# Patient Record
Sex: Female | Born: 1955 | Race: White | Hispanic: No | Marital: Married | Smoking: Never smoker
Health system: Southern US, Community
[De-identification: ages and names within clinical notes are randomized; demographics above are authoritative.]

## PROBLEM LIST (undated history)

## (undated) DIAGNOSIS — K219 Gastro-esophageal reflux disease without esophagitis: Secondary | ICD-10-CM

## (undated) HISTORY — PX: ANAL FISSURE REPAIR: SHX2312

## (undated) HISTORY — PX: BREAST SURGERY: SHX581

---

## 2008-06-07 ENCOUNTER — Other Ambulatory Visit: Admission: RE | Admit: 2008-06-07 | Discharge: 2008-06-07 | Payer: Self-pay | Admitting: Obstetrics and Gynecology

## 2009-06-27 ENCOUNTER — Other Ambulatory Visit: Admission: RE | Admit: 2009-06-27 | Discharge: 2009-06-27 | Payer: Self-pay | Admitting: Obstetrics and Gynecology

## 2010-06-27 ENCOUNTER — Other Ambulatory Visit (HOSPITAL_COMMUNITY)
Admission: RE | Admit: 2010-06-27 | Discharge: 2010-06-27 | Disposition: A | Discharge status: Home / Self Care | Payer: BC Managed Care – PPO | Source: Ambulatory Visit | Attending: Obstetrics and Gynecology | Admitting: Obstetrics and Gynecology

## 2010-06-27 DIAGNOSIS — Z01419 Encounter for gynecological examination (general) (routine) without abnormal findings: Secondary | ICD-10-CM | POA: Insufficient documentation

## 2011-04-18 ENCOUNTER — Other Ambulatory Visit: Payer: Self-pay | Admitting: Internal Medicine

## 2011-04-18 DIAGNOSIS — R251 Tremor, unspecified: Secondary | ICD-10-CM

## 2011-04-18 DIAGNOSIS — R42 Dizziness and giddiness: Secondary | ICD-10-CM

## 2011-04-23 ENCOUNTER — Other Ambulatory Visit: Payer: BC Managed Care – PPO

## 2012-08-09 ENCOUNTER — Other Ambulatory Visit: Payer: Self-pay | Admitting: Gastroenterology

## 2012-09-21 ENCOUNTER — Encounter (HOSPITAL_COMMUNITY): Payer: Self-pay | Admitting: Pharmacy Technician

## 2012-09-22 ENCOUNTER — Encounter (HOSPITAL_COMMUNITY): Payer: Self-pay | Admitting: *Deleted

## 2012-10-12 ENCOUNTER — Ambulatory Visit (HOSPITAL_COMMUNITY)
Admission: RE | Admit: 2012-10-12 | Discharge: 2012-10-12 | Disposition: A | Discharge status: Home / Self Care | Payer: BC Managed Care – PPO | Source: Ambulatory Visit | Attending: Gastroenterology | Admitting: Gastroenterology

## 2012-10-12 ENCOUNTER — Ambulatory Visit (HOSPITAL_COMMUNITY): Payer: BC Managed Care – PPO | Admitting: Anesthesiology

## 2012-10-12 ENCOUNTER — Encounter (HOSPITAL_COMMUNITY): Payer: Self-pay | Admitting: Anesthesiology

## 2012-10-12 ENCOUNTER — Encounter (HOSPITAL_COMMUNITY)
Admission: RE | Disposition: A | Discharge status: Home / Self Care | Payer: Self-pay | Source: Ambulatory Visit | Attending: Gastroenterology

## 2012-10-12 ENCOUNTER — Encounter (HOSPITAL_COMMUNITY): Payer: Self-pay | Admitting: *Deleted

## 2012-10-12 DIAGNOSIS — F329 Major depressive disorder, single episode, unspecified: Secondary | ICD-10-CM | POA: Insufficient documentation

## 2012-10-12 DIAGNOSIS — G4700 Insomnia, unspecified: Secondary | ICD-10-CM | POA: Insufficient documentation

## 2012-10-12 DIAGNOSIS — F3289 Other specified depressive episodes: Secondary | ICD-10-CM | POA: Insufficient documentation

## 2012-10-12 DIAGNOSIS — F411 Generalized anxiety disorder: Secondary | ICD-10-CM | POA: Insufficient documentation

## 2012-10-12 DIAGNOSIS — Z1211 Encounter for screening for malignant neoplasm of colon: Secondary | ICD-10-CM | POA: Insufficient documentation

## 2012-10-12 DIAGNOSIS — L259 Unspecified contact dermatitis, unspecified cause: Secondary | ICD-10-CM | POA: Insufficient documentation

## 2012-10-12 HISTORY — PX: COLONOSCOPY WITH PROPOFOL: SHX5780

## 2012-10-12 HISTORY — DX: Gastro-esophageal reflux disease without esophagitis: K21.9

## 2012-10-12 SURGERY — COLONOSCOPY WITH PROPOFOL
Anesthesia: General

## 2012-10-12 MED ORDER — MIDAZOLAM HCL 5 MG/5ML IJ SOLN
INTRAMUSCULAR | Status: DC | PRN
  Administered 2012-10-12: 2 mg via INTRAVENOUS

## 2012-10-12 MED ORDER — SODIUM CHLORIDE 0.9 % IV SOLN
INTRAVENOUS | Status: DC
  Administered 2012-10-12 (×2): via INTRAVENOUS

## 2012-10-12 MED ORDER — FENTANYL CITRATE 0.05 MG/ML IJ SOLN
INTRAMUSCULAR | Status: DC | PRN
  Administered 2012-10-12: 50 ug via INTRAVENOUS

## 2012-10-12 MED ORDER — PROPOFOL 10 MG/ML IV BOLUS
INTRAVENOUS | Status: DC | PRN
  Administered 2012-10-12: 50 mg via INTRAVENOUS
  Administered 2012-10-12: 25 mg via INTRAVENOUS
  Administered 2012-10-12: 50 mg via INTRAVENOUS
  Administered 2012-10-12: 25 mg via INTRAVENOUS

## 2012-10-12 SURGICAL SUPPLY — 21 items

## 2012-10-12 NOTE — H&P (Signed)
  Procedure: Baseline screening colonoscopy  History: The patient is a 57 year old female born [birth year]. The patient is scheduled to undergo her first screening colonoscopy with polypectomy to prevent colon cancer.  Chronic medications: Nasonex. Hydroxyzine. Mirtazapine. Ambien.  Past medical and surgical history: Depression. Anxiety. Insomnia. Eczema. Anal fissure repair surgery.  Allergies: Effexor causes itching.  Exam: The patient is alert and lying comfortably on the endoscopy stretcher. Abdomen is soft, flat, and nontender to palpation. Cardiac exam reveals a regular rhythm. Lungs are clear to auscultation.  Plan: Proceed with screening colonoscopy.

## 2012-10-12 NOTE — Anesthesia Postprocedure Evaluation (Signed)
  Anesthesia Post-op Note  Patient: Michelle Rosales  Procedure(s) Performed: Procedure(s) (LRB): COLONOSCOPY WITH PROPOFOL (N/A)  Patient Location: PACU  Anesthesia Type: General  Level of Consciousness: awake and alert   Airway and Oxygen Therapy: Patient Spontanous Breathing  Post-op Pain: mild  Post-op Assessment: Post-op Vital signs reviewed, Patient's Cardiovascular Status Stable, Respiratory Function Stable, Patent Airway and No signs of Nausea or vomiting  Last Vitals:  Filed Vitals:   10/12/12 1342  BP: 94/56  Pulse: 59  Temp: 36.6 C  Resp: 27    Post-op Vital Signs: stable   Complications: No apparent anesthesia complications

## 2012-10-12 NOTE — Transfer of Care (Signed)
Immediate Anesthesia Transfer of Care Note  Patient: Michelle Rosales  Procedure(s) Performed: Procedure(s): COLONOSCOPY WITH PROPOFOL (N/A)  Patient Location: PACU  Anesthesia Type:MAC  Level of Consciousness: awake, sedated and patient cooperative  Airway & Oxygen Therapy: Patient Spontanous Breathing and Patient connected to face mask oxygen  Post-op Assessment: Report given to PACU RN and Post -op Vital signs reviewed and stable  Post vital signs: Reviewed and stable  Complications: No apparent anesthesia complications

## 2012-10-12 NOTE — Anesthesia Preprocedure Evaluation (Addendum)
Anesthesia Evaluation  Patient identified by MRN, date of birth, ID band Patient awake    Reviewed: Allergy & Precautions, H&P , NPO status , Patient's Chart, lab work & pertinent test results  Airway Mallampati: II TM Distance: >3 FB Neck ROM: Full    Dental no notable dental hx.    Pulmonary neg pulmonary ROS,  breath sounds clear to auscultation  Pulmonary exam normal       Cardiovascular negative cardio ROS  Rhythm:Regular Rate:Normal     Neuro/Psych negative neurological ROS  negative psych ROS   GI/Hepatic negative GI ROS, Neg liver ROS,   Endo/Other  negative endocrine ROS  Renal/GU negative Renal ROS  negative genitourinary   Musculoskeletal negative musculoskeletal ROS (+)   Abdominal   Peds negative pediatric ROS (+)  Hematology negative hematology ROS (+)   Anesthesia Other Findings   Reproductive/Obstetrics negative OB ROS                           Anesthesia Physical Anesthesia Plan  ASA: I  Anesthesia Plan: General   Post-op Pain Management:    Induction: Intravenous  Airway Management Planned: Simple Face Mask  Additional Equipment:   Intra-op Plan:   Post-operative Plan:   Informed Consent: I have reviewed the patients History and Physical, chart, labs and discussed the procedure including the risks, benefits and alternatives for the proposed anesthesia with the patient or authorized representative who has indicated his/her understanding and acceptance.   Dental advisory given  Plan Discussed with: CRNA  Anesthesia Plan Comments:        Anesthesia Quick Evaluation

## 2012-10-12 NOTE — Op Note (Signed)
Procedure: Baseline screening colonoscopy  Endoscopist: Danise Edge  Premedication: Propofol administered by anesthesia  Procedure: The patient was placed in the left lateral decubitus position. Anal inspection and digital rectal exam were normal. The Pentax pediatric colonoscope was introduced into the rectum and advanced to the cecum. A normal-appearing appendiceal orifice and ileocecal valve were identified. Colonic preparation for the exam today was good.  Rectum. Normal. Retroflex view of the distal rectum normal.  Sigmoid colon and descending colon. Normal.  Splenic flexure. Normal.  Transverse colon. Normal.  Hepatic flexure. Normal.  Ascending colon. Normal.  Cecum and ileocecal valve. Normal.  Assessment: Normal baseline screening proctocolonoscopy to the cecum.  Recommendations: Schedule repeat screening colonoscopy in 10 years.

## 2012-10-13 ENCOUNTER — Encounter (HOSPITAL_COMMUNITY): Payer: Self-pay | Admitting: Gastroenterology

## 2014-05-11 ENCOUNTER — Other Ambulatory Visit: Payer: Self-pay | Admitting: Internal Medicine

## 2014-05-11 ENCOUNTER — Ambulatory Visit
Admission: RE | Admit: 2014-05-11 | Discharge: 2014-05-11 | Disposition: A | Discharge status: Home / Self Care | Payer: BC Managed Care – PPO | Source: Ambulatory Visit | Attending: Internal Medicine | Admitting: Internal Medicine

## 2014-05-11 DIAGNOSIS — M5489 Other dorsalgia: Secondary | ICD-10-CM

## 2014-05-11 DIAGNOSIS — M25552 Pain in left hip: Secondary | ICD-10-CM

## 2014-06-06 ENCOUNTER — Other Ambulatory Visit: Payer: Self-pay

## 2014-06-06 DIAGNOSIS — Z1231 Encounter for screening mammogram for malignant neoplasm of breast: Secondary | ICD-10-CM

## 2014-06-09 ENCOUNTER — Ambulatory Visit
Admission: RE | Admit: 2014-06-09 | Discharge: 2014-06-09 | Disposition: A | Discharge status: Home / Self Care | Payer: BC Managed Care – PPO | Source: Ambulatory Visit

## 2014-06-09 DIAGNOSIS — Z1231 Encounter for screening mammogram for malignant neoplasm of breast: Secondary | ICD-10-CM

## 2014-06-12 ENCOUNTER — Other Ambulatory Visit: Payer: Self-pay | Admitting: Internal Medicine

## 2014-06-12 DIAGNOSIS — R928 Other abnormal and inconclusive findings on diagnostic imaging of breast: Secondary | ICD-10-CM

## 2014-06-23 ENCOUNTER — Ambulatory Visit
Admission: RE | Admit: 2014-06-23 | Discharge: 2014-06-23 | Disposition: A | Discharge status: Home / Self Care | Payer: BC Managed Care – PPO | Source: Ambulatory Visit | Attending: Internal Medicine | Admitting: Internal Medicine

## 2014-06-23 ENCOUNTER — Other Ambulatory Visit: Payer: Self-pay | Admitting: Internal Medicine

## 2014-06-23 DIAGNOSIS — R928 Other abnormal and inconclusive findings on diagnostic imaging of breast: Secondary | ICD-10-CM

## 2014-06-28 ENCOUNTER — Ambulatory Visit
Admission: RE | Admit: 2014-06-28 | Discharge: 2014-06-28 | Disposition: A | Discharge status: Home / Self Care | Payer: BC Managed Care – PPO | Source: Ambulatory Visit | Attending: Internal Medicine | Admitting: Internal Medicine

## 2014-06-28 DIAGNOSIS — R928 Other abnormal and inconclusive findings on diagnostic imaging of breast: Secondary | ICD-10-CM

## 2014-07-10 ENCOUNTER — Ambulatory Visit: Payer: BC Managed Care – PPO | Admitting: Physical Therapy

## 2020-09-12 DIAGNOSIS — H2513 Age-related nuclear cataract, bilateral: Secondary | ICD-10-CM | POA: Diagnosis not present

## 2020-09-12 DIAGNOSIS — H52201 Unspecified astigmatism, right eye: Secondary | ICD-10-CM | POA: Diagnosis not present

## 2020-09-12 DIAGNOSIS — H5212 Myopia, left eye: Secondary | ICD-10-CM | POA: Diagnosis not present

## 2020-09-12 DIAGNOSIS — H47393 Other disorders of optic disc, bilateral: Secondary | ICD-10-CM | POA: Diagnosis not present

## 2020-09-12 DIAGNOSIS — H524 Presbyopia: Secondary | ICD-10-CM | POA: Diagnosis not present

## 2020-09-12 DIAGNOSIS — H43393 Other vitreous opacities, bilateral: Secondary | ICD-10-CM | POA: Diagnosis not present

## 2020-09-12 DIAGNOSIS — H5201 Hypermetropia, right eye: Secondary | ICD-10-CM | POA: Diagnosis not present

## 2020-09-12 DIAGNOSIS — H40003 Preglaucoma, unspecified, bilateral: Secondary | ICD-10-CM | POA: Diagnosis not present

## 2020-10-02 DIAGNOSIS — Z1231 Encounter for screening mammogram for malignant neoplasm of breast: Secondary | ICD-10-CM | POA: Diagnosis not present

## 2020-10-15 ENCOUNTER — Telehealth: Payer: Self-pay | Admitting: Family Medicine

## 2020-10-15 NOTE — Telephone Encounter (Signed)
Ok to schedule as new patient   

## 2020-10-15 NOTE — Telephone Encounter (Signed)
Pt, is looking to establish care with you she said Dr. Carmelia Roller spoke to you about her    Please advice

## 2020-10-20 NOTE — Progress Notes (Addendum)
[facility] [address], [address] [place], [place] [address] [phone]  Date:  10/24/2020   Name:  Michelle Rosales   DOB:  07-10-1955   MRN:  [id number]  PCP:  Pearline Cables, MD    Chief Complaint: New Patient (Initial Visit)   History of Present Illness:  Michelle Rosales is a 65 y.o. very pleasant female patient who presents with the following: Seen today as a new patient -referral per Dr. Carmelia Roller  Her former MD is leaving her practice so she would like to establish care here.  Her husband is a patient of Dr. 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  She completed 5 years of therapy in 2021 and is no longer taking tamoxifen, she has been cleared by oncology for routine screening mammogram She does take some OTC meds for pain in her joints- esp hips and hands She walks several miles a day  Her only real concern is joint pain.  She has pain in her right hip, this affects her when walking up steps or hills.  She has started using trekking poles for her walks to give her some support.  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   Past Medical History:  Diagnosis Date   GERD (gastroesophageal reflux disease)     Past Surgical History:  Procedure Laterality Date   ANAL FISSURE REPAIR     BREAST SURGERY  Maybe 6 years ago.   non cancerous lump removed from left breast   COLONOSCOPY WITH PROPOFOL N/A 10/12/2012   Procedure: COLONOSCOPY WITH PROPOFOL;  Surgeon: Charolett Bumpers, MD;  Location: [facility];  Service: Endoscopy;  Laterality: N/A;    Social History   Tobacco Use   Smoking status: Never   Smokeless tobacco: Never  Substance Use Topics   Alcohol use: Yes    Comment: occassionally   Drug use: No    Family History  Problem Relation Age of Onset   Arthritis Mother    Asthma Brother    Cancer Sister    Cancer Sister     Allergies  Allergen Reactions   Venlafaxine Itching    Medication list has been reviewed and updated.  Current Outpatient Medications on File Prior to Visit  Medication Sig Dispense Refill   acetaminophen (TYLENOL) 500 MG tablet Take 500 mg by mouth every 6 (six)  hours as needed for pain.     bismuth subsalicylate (PEPTO BISMOL) 262 MG/15ML suspension Take 15 mLs by mouth every 6 (six) hours as needed for indigestion.     Calcium Carbonate (CALTRATE 600 PO) Take 600 mg by mouth daily.     naproxen sodium (ANAPROX) 220 MG tablet Take 220 mg by mouth 2 (two) times daily as needed (for pain).     No current facility-administered medications on file prior to visit.    Review of Systems:  As per HPI- otherwise negative. No chest pain or shortness of breath with exercise, no postmenopausal bleeding  Physical Examination: Vitals:   10/24/20 0943  BP: 124/62  Pulse: 68  Resp: 16  Temp: (!) 97.1 F (36.2 C)  SpO2: 99%   Vitals:   10/24/20 0943  Weight: 135 lb (61.2  kg)  Height: 5\' 6"  (1.676 m)   Body mass index is 21.79 kg/m. Ideal Body Weight: Weight in (lb) to have BMI = 25: 154.6  GEN: no acute distress.  Normal weight, looks well HEENT: Atraumatic, Normocephalic.Bilateral TM wnl, oropharynx normal.  PEERL,EOMI.   Ears and Nose: No external deformity. CV: RRR, No M/G/R. No JVD. No thrill. No extra heart sounds. PULM: CTA B, no wheezes, crackles, rhonchi. No retractions. No resp. distress. No accessory muscle use. ABD: S, NT, ND, +BS. No rebound. No HSM. EXTR: No c/c/e PSYCH: Normally interactive. Conversant.  Some osteoarthritic thickening is present in hand joints Normal range of motion of the right hip  Assessment and Plan: Hip pain, right - Plan: DG HIP UNILAT W OR W/O PELVIS 2-3 VIEWS RIGHT  Screening for deficiency anemia - Plan: CBC  Screening for hyperlipidemia - Plan: Lipid panel  Fatigue, unspecified type - Plan: TSH, VITAMIN D 25 Hydroxy (Vit-D Deficiency, Fractures)  Screening for diabetes mellitus - Plan: Comprehensive metabolic panel, Hemoglobin A1c  Primary osteoarthritis of right hip  Patient today to establish care and discuss a couple of concerns.  We updated her health maintenance and ordered routine labs today.  In general, [name] is in excellent health.  Her only real concern is arthritis pain, especially in the right hip.  We discussed this today, she would be interested in obtaining plain to determine severity of any arthritis.  Otherwise, she is doing very well-she is quite physically active and has maintained a healthy weight Will plan further follow- up pending labs.  This visit occurred during the SARS-CoV-2 public health emergency.  Safety protocols were in place, including screening questions prior to the visit, additional usage of staff PPE, and extensive cleaning of exam room while observing appropriate contact time as indicated for disinfecting solutions.   Signed Marjean Donna, MD  Her hip films came  back as follows, message to patient  DG HIP UNILAT W OR W/O PELVIS 2-3 VIEWS RIGHT  Result Date: 10/24/2020 CLINICAL DATA:  Atraumatic hip pain with suspected osteoarthritis. EXAM: DG HIP (WITH OR WITHOUT PELVIS) 2-3V RIGHT COMPARISON:  None. FINDINGS: There is no evidence of hip fracture or dislocation. No degenerative hip narrowing or notable spurring. Small os acetabulum on the right. Notable L3-4 degenerative disc narrowing with asymmetric right-sided height loss, also seen by 2016 lumbar radiography. IMPRESSION: No acute finding or degenerative hip narrowing. Electronically Signed   By: 2017 M.D.   On: 10/24/2020 10:55     Check on pneumonia vaccine with results report  Received her labs as below, message to pt  Results for orders placed or performed in visit  on 10/24/20  CBC  Result Value Ref Range   WBC 5.3 4.0 - 10.5 K/uL   RBC 3.83 (L) 3.87 - 5.11 Mil/uL   Platelets 160.0 150.0 - 400.0 K/uL   Hemoglobin 12.2 12.0 - 15.0 g/dL   HCT 27.7 41.2 - 87.8 %   MCV 94.9 78.0 - 100.0 fl   MCHC 33.7 30.0 - 36.0 g/dL   RDW 67.6 72.0 - 94.7 %  Comprehensive metabolic panel  Result Value Ref Range   Sodium 139 135 - 145 mEq/L   Potassium 4.3 3.5 - 5.1 mEq/L   Chloride 105 96 - 112 mEq/L   CO2 27 19 - 32 mEq/L   Glucose, Bld 80 70 - 99 mg/dL   BUN 19 6 - 23 mg/dL   Creatinine, Ser 0.96 0.40 - 1.20 mg/dL   Total Bilirubin 0.5 0.2 - 1.2 mg/dL   Alkaline Phosphatase 80 39 - 117 U/L   AST 22 0 - 37 U/L   ALT 18 0 - 35 U/L   Total Protein 6.6 6.0 - 8.3 g/dL   Albumin 4.3 3.5 - 5.2 g/dL   GFR 28.36 >62.94 mL/min   Calcium 9.3 8.4 - 10.5 mg/dL  Hemoglobin T6L  Result Value Ref Range   Hgb A1c MFr Bld 5.9 4.6 - 6.5 %  Lipid panel  Result Value Ref Range   Cholesterol 191 0 - 200 mg/dL   Triglycerides 46.5 0.0 - 149.0 mg/dL   HDL 03.54 >65.68 mg/dL   VLDL 12.7 0.0 - 51.7 mg/dL   LDL Cholesterol 98 0 - 99 mg/dL   Total CHOL/HDL Ratio 3    NonHDL 115.50   TSH  Result  Value Ref Range   TSH 1.00 0.35 - 4.50 uIU/mL  VITAMIN D 25 Hydroxy (Vit-D Deficiency, Fractures)  Result Value Ref Range   VITD 38.50 30.00 - 100.00 ng/mL

## 2020-10-22 ENCOUNTER — Ambulatory Visit: Payer: BC Managed Care – PPO | Admitting: Family Medicine

## 2020-10-24 ENCOUNTER — Encounter: Payer: Self-pay | Admitting: Family Medicine

## 2020-10-24 ENCOUNTER — Ambulatory Visit (HOSPITAL_BASED_OUTPATIENT_CLINIC_OR_DEPARTMENT_OTHER)
Admission: RE | Admit: 2020-10-24 | Discharge: 2020-10-24 | Disposition: A | Discharge status: Home / Self Care | Payer: Medicare HMO | Source: Ambulatory Visit | Attending: Family Medicine | Admitting: Family Medicine

## 2020-10-24 ENCOUNTER — Other Ambulatory Visit: Payer: Self-pay

## 2020-10-24 ENCOUNTER — Ambulatory Visit (INDEPENDENT_AMBULATORY_CARE_PROVIDER_SITE_OTHER): Payer: Medicare HMO | Admitting: Family Medicine

## 2020-10-24 VITALS — BP 124/62 | HR 68 | Temp 97.1°F | Resp 16 | Ht 66.0 in | Wt 135.0 lb

## 2020-10-24 DIAGNOSIS — Z13220 Encounter for screening for lipoid disorders: Secondary | ICD-10-CM

## 2020-10-24 DIAGNOSIS — R5383 Other fatigue: Secondary | ICD-10-CM

## 2020-10-24 DIAGNOSIS — Z130 Encounter for screening for diseases of the blood and blood-forming organs and certain disorders involving the immune mechanism: Secondary | ICD-10-CM

## 2020-10-24 DIAGNOSIS — M1611 Unilateral primary osteoarthritis, right hip: Secondary | ICD-10-CM | POA: Diagnosis not present

## 2020-10-24 DIAGNOSIS — R7303 Prediabetes: Secondary | ICD-10-CM

## 2020-10-24 DIAGNOSIS — Z131 Encounter for screening for diabetes mellitus: Secondary | ICD-10-CM

## 2020-10-24 DIAGNOSIS — M25551 Pain in right hip: Secondary | ICD-10-CM | POA: Insufficient documentation

## 2020-10-24 DIAGNOSIS — M5136 Other intervertebral disc degeneration, lumbar region: Secondary | ICD-10-CM | POA: Diagnosis not present

## 2020-10-24 LAB — COMPREHENSIVE METABOLIC PANEL
ALT: 18 U/L (ref 0–35)
AST: 22 U/L (ref 0–37)
Albumin: 4.3 g/dL (ref 3.5–5.2)
Alkaline Phosphatase: 80 U/L (ref 39–117)
BUN: 19 mg/dL (ref 6–23)
CO2: 27 mEq/L (ref 19–32)
Calcium: 9.3 mg/dL (ref 8.4–10.5)
Chloride: 105 mEq/L (ref 96–112)
Creatinine, Ser: 0.66 mg/dL (ref 0.40–1.20)
GFR: 91.02 mL/min (ref >60.00)
Glucose, Bld: 80 mg/dL (ref 70–99)
Potassium: 4.3 mEq/L (ref 3.5–5.1)
Sodium: 139 mEq/L (ref 135–145)
Total Bilirubin: 0.5 mg/dL (ref 0.2–1.2)
Total Protein: 6.6 g/dL (ref 6.0–8.3)

## 2020-10-24 LAB — TSH: TSH: 1 u[IU]/mL (ref 0.35–4.50)

## 2020-10-24 LAB — CBC
HCT: 36.3 % (ref 36.0–46.0)
Hemoglobin: 12.2 g/dL (ref 12.0–15.0)
MCHC: 33.7 g/dL (ref 30.0–36.0)
MCV: 94.9 fl (ref 78.0–100.0)
Platelets: 160 10*3/uL (ref 150.0–400.0)
RBC: 3.83 Mil/uL — ABNORMAL LOW (ref 3.87–5.11)
RDW: 13.6 % (ref 11.5–15.5)
WBC: 5.3 10*3/uL (ref 4.0–10.5)

## 2020-10-24 LAB — LIPID PANEL
Cholesterol: 191 mg/dL (ref 0–200)
HDL: 75.2 mg/dL (ref >39.00)
LDL Cholesterol: 98 mg/dL (ref 0–99)
NonHDL: 115.5
Total CHOL/HDL Ratio: 3
Triglycerides: 87 mg/dL (ref 0.0–149.0)
VLDL: 17.4 mg/dL (ref 0.0–40.0)

## 2020-10-24 LAB — HEMOGLOBIN A1C: Hgb A1c MFr Bld: 5.9 % (ref 4.6–6.5)

## 2020-10-24 LAB — VITAMIN D 25 HYDROXY (VIT D DEFICIENCY, FRACTURES): VITD: 38.5 ng/mL (ref 30.00–100.00)

## 2020-10-24 NOTE — Patient Instructions (Signed)
It was great to meet you today- take care and I will be in touch with your labs and hip film results asap  I will check on what pneumonia vaccine you might need and let you know  Health Maintenance After Age 67 After age 55, you are at a higher risk for certain long-term diseases and infections as well as injuries from falls. Falls are a major cause of broken bones and head injuries in people who are older than age 65. Getting regular preventive care can help to keep you healthy and well. Preventive care includes getting regular testing and making lifestyle changes as recommended by your health care provider. Talk with your health care provider about: Which screenings and tests you should have. A screening is a test that checks for a disease when you have no symptoms. A diet and exercise plan that is right for you. What should I know about screenings and tests to prevent falls? Screening and testing are the best ways to find a health problem early. Early diagnosis and treatment give you the best chance of managing medical conditions that are common after age 72. Certain conditions and lifestyle choices may make you more likely to have a fall. Your health care provider may recommend: Regular vision checks. Poor vision and conditions such as cataracts can make you more likely to have a fall. If you wear glasses, make sure to get your prescription updated if your vision changes. Medicine review. Work with your health care provider to regularly review all of the medicines you are taking, including over-the-counter medicines. Ask your health care provider about any side effects that may make you more likely to have a fall. Tell your health care provider if any medicines that you take make you feel dizzy or sleepy. Osteoporosis screening. Osteoporosis is a condition that causes the bones to get weaker. This can make the bones weak and cause them to break more easily. Blood pressure screening. Blood pressure  changes and medicines to control blood pressure can make you feel dizzy. Strength and balance checks. Your health care provider may recommend certain tests to check your strength and balance while standing, walking, or changing positions. Foot health exam. Foot pain and numbness, as well as not wearing proper footwear, can make you more likely to have a fall. Depression screening. You may be more likely to have a fall if you have a fear of falling, feel emotionally low, or feel unable to do activities that you used to do. Alcohol use screening. Using too much alcohol can affect your balance and may make you more likely to have a fall. What actions can I take to lower my risk of falls? General instructions Talk with your health care provider about your risks for falling. Tell your health care provider if: You fall. Be sure to tell your health care provider about all falls, even ones that seem minor. You feel dizzy, sleepy, or off-balance. Take over-the-counter and prescription medicines only as told by your health care provider. These include any supplements. Eat a healthy diet and maintain a healthy weight. A healthy diet includes low-fat dairy products, low-fat (lean) meats, and fiber from whole grains, beans, and lots of fruits and vegetables. Home safety Remove any tripping hazards, such as rugs, cords, and clutter. Install safety equipment such as grab bars in bathrooms and safety rails on stairs. Keep rooms and walkways well-lit. Activity  Follow a regular exercise program to stay fit. This will help you maintain your balance.  Ask your health care provider what types of exercise are appropriate for you. If you need a cane or walker, use it as recommended by your health care provider. Wear supportive shoes that have nonskid soles.  Lifestyle Do not drink alcohol if your health care provider tells you not to drink. If you drink alcohol, limit how much you have: 0-1 drink a day for  women. 0-2 drinks a day for men. Be aware of how much alcohol is in your drink. In the [place] one drink equals one typical bottle of beer (12 oz), one-half glass of wine (5 oz), or one shot of hard liquor (1 oz). Do not use any products that contain nicotine or tobacco, such as cigarettes and e-cigarettes. If you need help quitting, ask your health care provider. Summary Having a healthy lifestyle and getting preventive care can help to protect your health and wellness after age 66. Screening and testing are the best way to find a health problem early and help you avoid having a fall. Early diagnosis and treatment give you the best chance for managing medical conditions that are more common for people who are older than age 56. Falls are a major cause of broken bones and head injuries in people who are older than age 57. Take precautions to prevent a fall at home. Work with your health care provider to learn what changes you can make to improve your health and wellness and to prevent falls. This information is not intended to replace advice given to you by your health care provider. Make sure you discuss any questions you have with your healthcare provider. Document Revised: 04/06/2020 Document Reviewed: 04/06/2020 Elsevier Patient Education  2022 [employer].

## 2020-12-31 DIAGNOSIS — T1512XA Foreign body in conjunctival sac, left eye, initial encounter: Secondary | ICD-10-CM | POA: Diagnosis not present

## 2020-12-31 DIAGNOSIS — H5789 Other specified disorders of eye and adnexa: Secondary | ICD-10-CM | POA: Diagnosis not present

## 2021-02-06 ENCOUNTER — Other Ambulatory Visit (HOSPITAL_BASED_OUTPATIENT_CLINIC_OR_DEPARTMENT_OTHER): Payer: Self-pay

## 2021-02-06 ENCOUNTER — Ambulatory Visit: Payer: Medicare HMO | Attending: Internal Medicine

## 2021-02-06 DIAGNOSIS — Z23 Encounter for immunization: Secondary | ICD-10-CM

## 2021-02-06 MED ORDER — INFLUENZA VAC A&B SA ADJ QUAD 0.5 ML IM PRSY
PREFILLED_SYRINGE | INTRAMUSCULAR | 0 refills | Status: DC
  Filled 2021-02-06: qty 0.5, 1d supply, fill #0

## 2021-02-06 NOTE — Progress Notes (Signed)
   Covid-19 Vaccination Clinic  Name:  Michelle Rosales    MRN: [id number] DOB: 03-30-1956  02/06/2021  Michelle Rosales was observed post Covid-19 immunization for 15 minutes without incident. She was provided with Vaccine Information Sheet and instruction to access the V-Safe system.   Michelle Rosales was instructed to call 911 with any severe reactions post vaccine: Difficulty breathing  Swelling of face and throat  A fast heartbeat  A bad rash all over body  Dizziness and weakness

## 2021-02-15 ENCOUNTER — Other Ambulatory Visit (HOSPITAL_BASED_OUTPATIENT_CLINIC_OR_DEPARTMENT_OTHER): Payer: Self-pay

## 2021-02-15 MED ORDER — COVID-19MRNA BIVAL VACC PFIZER 30 MCG/0.3ML IM SUSP
INTRAMUSCULAR | 0 refills | Status: DC
  Filled 2021-02-15: qty 0.3, 1d supply, fill #0

## 2021-02-19 NOTE — Progress Notes (Signed)
[facility] [address], [address] [place], [place] [address] [phone]  Date:  02/20/2021   Name:  Michelle Rosales   DOB:  1956/01/11   MRN:  [id number]  PCP:  Pearline Cables, MD    Chief Complaint: Hip Pain (Right side, has worsened since last OV. Tylenol/ Aleve no longer helps.No interfering with sleep and hiking. )   History of Present Illness:  Michelle Rosales is a 65 y.o. very pleasant female patient who presents with the following:  Michelle Rosales is here today with concern of hip pain.  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  She completed 5 years of therapy in 2021 and is no longer taking tamoxifen, she has been cleared by oncology for routine screening mammogram She does take some OTC meds for pain in her joints- esp hips and hands She walks several miles a day  Her only real concern is joint pain.  She has pain in her right hip, this affects her when walking up steps or hills.  She has started using trekking poles for her walks to give her some support.  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  She notes she was able to run up and down flights of stairs at her job with no problem  She feels like the pain does radiate down her inner leg and may go as far as her ankle and foot She does have a history of degenerative lumbar disease  No numbness or weakness of her leg, no change in her bowel or bladder control  She will use aleve and tylenol as needed She has used some Voltaren belonging to her husband recently which helps some of the time The pain may keep her awake at night  The left hip is okay  She does not notice any back pain  COVID booster and flu shots already complete   Patient Active Problem List   Diagnosis Date Noted   Pre-diabetes 10/24/2020    Past Medical History:  Diagnosis Date   GERD (gastroesophageal reflux disease)     Past Surgical History:  Procedure Laterality Date   ANAL FISSURE REPAIR     BREAST SURGERY  Maybe 6 years ago.   non cancerous lump removed from left breast   COLONOSCOPY WITH PROPOFOL N/A 10/12/2012   Procedure: COLONOSCOPY WITH PROPOFOL;  Surgeon: Charolett Bumpers, MD;  Location: [facility];  Service: Endoscopy;  Laterality: N/A;    Social History  Tobacco Use   Smoking status: Never   Smokeless tobacco: Never  Substance Use Topics   Alcohol use: Yes    Comment: occassionally   Drug use: No    Family History  Problem Relation Age of Onset   Arthritis Mother    Asthma Brother    Cancer Sister    Cancer Sister     Allergies  Allergen Reactions   Venlafaxine Itching    Medication list has been reviewed and updated.  Current Outpatient Medications on File Prior to Visit  Medication Sig Dispense Refill   acetaminophen (TYLENOL) 500 MG tablet Take 500 mg by mouth every 6 (six) hours as needed for pain.     bismuth subsalicylate (PEPTO BISMOL) 262 MG/15ML suspension Take 15 mLs by mouth every 6 (six) hours as needed for indigestion.     Calcium Carbonate (CALTRATE  600 PO) Take 600 mg by mouth daily.     COVID-19 mRNA bivalent vaccine, Pfizer, injection Inject into the muscle. 0.3 mL 0   influenza vaccine adjuvanted (FLUAD) 0.5 ML injection Inject into the muscle. 0.5 mL 0   naproxen sodium (ANAPROX) 220 MG tablet Take 220 mg by mouth 2 (two) times daily as needed (for pain).     No current facility-administered medications on file prior to visit.    Review of Systems:  As per HPI- otherwise negative.   Physical Examination: Vitals:   02/20/21 1128  BP: 130/84  Pulse: 74  Resp: 18  Temp: (!) 97.4 F (36.3 C)  SpO2: 100%   Vitals:   02/20/21 1128  Weight: 135 lb 6.4 oz (61.4 kg)  Height: 5\' 6"  (1.676 m)   Body mass index is 21.85 kg/m. Ideal Body Weight: Weight in (lb) to have BMI = 25: 154.6  GEN: no acute distress.  Slender build, looks well HEENT: Atraumatic, Normocephalic.  Ears and Nose: No external deformity. CV: RRR, No M/G/R. No JVD. No thrill. No extra heart sounds. PULM: CTA B, no wheezes, crackles, rhonchi. No retractions. No resp. distress. No accessory muscle use. ABD: S, NT, ND EXTR: No c/c/e PSYCH: Normally interactive. Conversant.  Right hip: Normal range of motion, but she notes pain with flexed abduction.  She is not tender over the greater trochanter.  Her hip flexor muscles are not painful. Normal strength, sensation of both lower extremities, negative straight leg raise.  Possible decrease in patellar tendon reflex on the right compared with left  Assessment and Plan: Right hip pain - Plan: diclofenac (VOLTAREN) 75 MG EC tablet, Ambulatory referral to Orthopedic Surgery  Pain in both feet - Plan: Ambulatory referral to Podiatry  Patient seen today with right hip pain, persistent for about 6 months.  Plain films earlier this year were negative-although they do mention lumbar spine changes   FINDINGS: There is no evidence of hip fracture or dislocation. No degenerative hip narrowing or notable spurring.  Small os acetabulum on the right. Notable L3-4 degenerative disc narrowing with asymmetric right-sided height loss, also seen by 2016 lumbar radiography.   IMPRESSION: No acute finding or degenerative hip narrowing.   Referral to orthopedics for further evaluation.  Provide prescription for Voltaren She also request to see her podiatrist again, she has some orthotics made years ago which are very worn out  Signed 2017, MD

## 2021-02-20 ENCOUNTER — Ambulatory Visit (INDEPENDENT_AMBULATORY_CARE_PROVIDER_SITE_OTHER): Payer: Medicare HMO | Admitting: Family Medicine

## 2021-02-20 ENCOUNTER — Other Ambulatory Visit: Payer: Self-pay

## 2021-02-20 VITALS — BP 130/84 | HR 74 | Temp 97.4°F | Resp 18 | Ht 66.0 in | Wt 135.4 lb

## 2021-02-20 DIAGNOSIS — M79671 Pain in right foot: Secondary | ICD-10-CM | POA: Diagnosis not present

## 2021-02-20 DIAGNOSIS — M79672 Pain in left foot: Secondary | ICD-10-CM

## 2021-02-20 DIAGNOSIS — M25551 Pain in right hip: Secondary | ICD-10-CM | POA: Diagnosis not present

## 2021-02-20 MED ORDER — DICLOFENAC SODIUM 75 MG PO TBEC: 75.0000 mg | DELAYED_RELEASE_TABLET | Freq: Two times a day (BID) | ORAL | 2 refills | Status: DC | PRN

## 2021-02-20 NOTE — Patient Instructions (Signed)
Good to see you today- we will get you set up to see Dr Hal Neer about your orthotics  Address: [phone], [address], [place], [place] [address] Phone: [phone]  And [facility] about your hip - please feel free to give them a call Located in: [facility] Address: [address] [place], [place] [address] Phone: [phone]  Ok to use voltaren and/ or tylenol as needed for pain Let me know if any major change in your symptoms

## 2021-02-27 DIAGNOSIS — M1611 Unilateral primary osteoarthritis, right hip: Secondary | ICD-10-CM | POA: Diagnosis not present

## 2021-03-14 DIAGNOSIS — M79672 Pain in left foot: Secondary | ICD-10-CM | POA: Diagnosis not present

## 2021-03-14 DIAGNOSIS — M76811 Anterior tibial syndrome, right leg: Secondary | ICD-10-CM | POA: Diagnosis not present

## 2021-03-14 DIAGNOSIS — M79671 Pain in right foot: Secondary | ICD-10-CM | POA: Diagnosis not present

## 2021-03-18 ENCOUNTER — Telehealth: Payer: Self-pay | Admitting: Family Medicine

## 2021-03-18 NOTE — Telephone Encounter (Signed)
Left message for patient to call back and schedule Medicare Annual Wellness Visit (AWV) in office.  ° °If not able to come in office, please offer to do virtually or by telephone.  Left office number and my jabber #[phone]. ° °Due for AWVI ° °Please schedule at anytime with Nurse Health Advisor. °  °

## 2021-03-19 ENCOUNTER — Ambulatory Visit (INDEPENDENT_AMBULATORY_CARE_PROVIDER_SITE_OTHER): Payer: Medicare HMO

## 2021-03-19 ENCOUNTER — Encounter: Payer: Self-pay | Admitting: Family Medicine

## 2021-03-19 VITALS — Ht 66.0 in | Wt 135.0 lb

## 2021-03-19 DIAGNOSIS — Z0000 Encounter for general adult medical examination without abnormal findings: Secondary | ICD-10-CM | POA: Diagnosis not present

## 2021-03-19 DIAGNOSIS — M25551 Pain in right hip: Secondary | ICD-10-CM

## 2021-03-19 MED ORDER — DICLOFENAC SODIUM 75 MG PO TBEC: 75.0000 mg | DELAYED_RELEASE_TABLET | Freq: Two times a day (BID) | ORAL | 0 refills | Status: DC | PRN

## 2021-03-19 NOTE — Progress Notes (Signed)
Subjective:   Michelle Rosales is a 65 y.o. female who presents for an Initial Medicare Annual Wellness Visit.   I connected with Michelle Rosales today by telephone and verified that I am speaking with the correct person using two identifiers. Location patient: home Location provider: work Persons participating in the virtual visit: patient, Engineer, civil (consulting).    I discussed the limitations, risks, security and privacy concerns of performing an evaluation and management service by telephone and the availability of in person appointments. I also discussed with the patient that there may be a patient responsible charge related to this service. The patient expressed understanding and verbally consented to this telephonic visit.    Interactive audio and video telecommunications were attempted between this provider and patient, however failed, due to patient having technical difficulties OR patient did not have access to video capability.  We continued and completed visit with audio only.  Some vital signs may be absent or patient reported.   Time Spent with patient on telephone encounter: 20 minutes  Review of Systems     Cardiac Risk Factors include: advanced age (>71men, >47 women)     Objective:    Today's Vitals   03/19/21 1507 03/19/21 1508  Weight: 135 lb (61.2 kg)   Height: 5\' 6"  (1.676 m)   PainSc:  5    Body mass index is 21.79 kg/m.  Advanced Directives 03/19/2021 10/12/2012 09/22/2012  Does Patient Have a Medical Advance Directive? Yes Patient has advance directive, copy not in chart Patient has advance directive, copy not in chart  Type of Advance Directive Healthcare Power of [place];Living will Healthcare Power of [place];Living will  Copy of Healthcare Power of Attorney in Chart? No - copy requested Copy requested from family Copy requested from family    Current Medications (verified) Outpatient Encounter Medications as of 03/19/2021  Medication Sig    acetaminophen (TYLENOL) 500 MG tablet Take 500 mg by mouth every 6 (six) hours as needed for pain.   bismuth subsalicylate (PEPTO BISMOL) 262 MG/15ML suspension Take 15 mLs by mouth every 6 (six) hours as needed for indigestion.   Calcium Carbonate (CALTRATE 600 PO) Take 600 mg by mouth daily.   diclofenac (VOLTAREN) 75 MG EC tablet Take 1 tablet (75 mg total) by mouth 2 (two) times daily as needed.   naproxen sodium (ANAPROX) 220 MG tablet Take 220 mg by mouth 2 (two) times daily as needed (for pain).   COVID-19 mRNA bivalent vaccine, Pfizer, injection Inject into the muscle. (Patient not taking: Reported on 03/19/2021)   influenza vaccine adjuvanted (FLUAD) 0.5 ML injection Inject into the muscle. (Patient not taking: Reported on 03/19/2021)   No facility-administered encounter medications on file as of 03/19/2021.    Allergies (verified) Venlafaxine   History: Past Medical History:  Diagnosis Date   GERD (gastroesophageal reflux disease)    Past Surgical History:  Procedure Laterality Date   ANAL FISSURE REPAIR     BREAST SURGERY  Maybe 6 years ago.   non cancerous lump removed from left breast   COLONOSCOPY WITH PROPOFOL N/A 10/12/2012   Procedure: COLONOSCOPY WITH PROPOFOL;  Surgeon: 12/12/2012, MD;  Location: [facility];  Service: Endoscopy;  Laterality: N/A;   Family History  Problem Relation Age of Onset   Arthritis Mother    Asthma Brother    Cancer Sister    Cancer Sister    Social History   Socioeconomic History   Marital status: Married  Spouse name: Not on file   Number of children: Not on file   Years of education: Not on file   Highest education level: Not on file  Occupational History   Not on file  Tobacco Use   Smoking status: Never   Smokeless tobacco: Never  Substance and Sexual Activity   Alcohol use: Yes    Comment: occassionally   Drug use: No   Sexual activity: Not on file  Other Topics Concern   Not on file  Social History  Narrative   Not on file   Social Determinants of Health   Financial Resource Strain: Low Risk    Difficulty of Paying Living Expenses: Not hard at all  Food Insecurity: No Food Insecurity   Worried About Running Out of Food in the Last Year: Never true   Ran Out of Food in the Last Year: Never true  Transportation Needs: No Transportation Needs   Lack of Transportation (Medical): No   Lack of Transportation (Non-Medical): No  Physical Activity: Sufficiently Active   Days of Exercise per Week: 7 days   Minutes of Exercise per Session: 60 min  Stress: No Stress Concern Present   Feeling of Stress : Not at all  Social Connections: Moderately Integrated   Frequency of Communication with Friends and Family: More than three times a week   Frequency of Social Gatherings with Friends and Family: More than three times a week   Attends Religious Services: More than 4 times per year   Active Member of [employer] or Organizations: No   Attends Engineer, structural: Never   Marital Status: Married    Tobacco Counseling Counseling given: Not Answered   Clinical Intake:  Pre-visit preparation completed: Yes  Pain : 0-10 Pain Score: 5  Pain Type: Chronic pain Pain Location: Leg Pain Onset: More than a month ago Pain Frequency: Constant     BMI - recorded: 21.79 Nutritional Status: BMI of 19-24  Normal Nutritional Risks: None Diabetes: No  How often do you need to have someone help you when you read instructions, pamphlets, or other written materials from your doctor or pharmacy?: 1 - Never  Diabetic?No  Interpreter Needed?: No  Information entered by :: Thomasenia Sales LPN   Activities of Daily Living In your present state of health, do you have any difficulty performing the following activities: 03/19/2021  Hearing? N  Vision? N  Difficulty concentrating or making decisions? N  Walking or climbing stairs? N  Dressing or bathing? N  Doing errands, shopping? N   Preparing Food and eating ? N  Using the Toilet? N  In the past six months, have you accidently leaked urine? N  Do you have problems with loss of bowel control? N  Managing your Medications? N  Managing your Finances? N  Housekeeping or managing your Housekeeping? N  Some recent data might be hidden    Patient Care Team: Copland, Gwenlyn Found, MD as PCP - General (Family Medicine)  Indicate any recent Medical Services you may have received from other than [facility] providers in the past year (date may be approximate).     Assessment:   This is a routine wellness examination for Michelle Rosales.  Hearing/Vision screen Hearing Screening - Comments:: No issues Vision Screening - Comments:: Last eye exam-2022-Dr. Teppidino  Dietary issues and exercise activities discussed: Current Exercise Habits: Home exercise routine, Type of exercise: walking, Time (Minutes): 60, Frequency (Times/Week): 7, Weekly Exercise (Minutes/Week): 420, Intensity: Mild, Exercise limited by: None  identified   Goals Addressed             This Visit's Progress    Patient Stated       Drink more water       Depression Screen PHQ 2/9 Scores 03/19/2021 10/24/2020  PHQ - 2 Score 0 0    Fall Risk Fall Risk  03/19/2021 10/24/2020  Falls in the past year? 0 0  Number falls in past yr: 0 -  Injury with Fall? 0 -  Follow up Falls prevention discussed -    FALL RISK PREVENTION PERTAINING TO THE HOME:  Any stairs in or around the home? No  Home free of loose throw rugs in walkways, pet beds, electrical cords, etc? Yes  Adequate lighting in your home to reduce risk of falls? Yes   ASSISTIVE DEVICES UTILIZED TO PREVENT FALLS:  Life alert? No  Use of a cane, walker or w/c? No  Grab bars in the bathroom? No  Shower chair or bench in shower? Yes  Elevated toilet seat or a handicapped toilet? Yes Elevated toilet seats  TIMED UP AND GO:  Was the test performed? No . Phone visit   Cognitive Function:Normal  cognitive status assessed by this Nurse Health Advisor. No abnormalities found.          Immunizations Immunization History  Administered Date(s) Administered   Fluad Quad(high Dose 65+) 02/06/2021   Influenza Split 02/03/2017, 02/09/2018, 02/06/2021   Influenza, High Dose Seasonal PF 02/04/2016, 01/28/2019   PFIZER(Purple Top)SARS-COV-2 Vaccination 06/02/2019, 06/28/2019, 01/30/2020   Pfizer Covid-19 Vaccine Bivalent Booster 71yrs & up 02/06/2021   Pneumococcal Conjugate-13 09/13/2019   Tdap 06/06/2015    TDAP status: Up to date  Flu Vaccine status: Up to date  Pneumococcal vaccine status: Due, Education has been provided regarding the importance of this vaccine. Advised may receive this vaccine at local pharmacy or Health Dept. Aware to provide a copy of the vaccination record if obtained from local pharmacy or Health Dept. Verbalized acceptance and understanding.  Covid-19 vaccine status: Completed vaccines  Qualifies for Shingles Vaccine? Yes   Zostavax completed No   Shingrix Completed?: No.    Education has been provided regarding the importance of this vaccine. Patient has been advised to call insurance company to determine out of pocket expense if they have not yet received this vaccine. Advised may also receive vaccine at local pharmacy or Health Dept. Verbalized acceptance and understanding.  Screening Tests Health Maintenance  Topic Date Due   Zoster Vaccines- Shingrix (1 of 2) Never done   Pneumonia Vaccine 49+ Years old (2 - PPSV23 if available, else PCV20) 09/12/2020   MAMMOGRAM  09/15/2022   COLONOSCOPY (Pts 45-2yrs Insurance coverage will need to be confirmed)  10/13/2022   TETANUS/TDAP  06/05/2025   INFLUENZA VACCINE  Completed   DEXA SCAN  Completed   COVID-19 Vaccine  Completed   Hepatitis C Screening  Completed   HPV VACCINES  Aged Out    Health Maintenance  Health Maintenance Due  Topic Date Due   Zoster Vaccines- Shingrix (1 of 2) Never done    Pneumonia Vaccine 56+ Years old (2 - PPSV23 if available, else PCV20) 09/12/2020    Colorectal cancer screening: Type of screening: Colonoscopy. Completed 10/12/2012. Repeat every 10 years  Mammogram status: Completed bilateral 09/14/2020. Repeat every year  Bone Density status: Completed 12/19/2019-Due 12/18/2021. No report available.  Lung Cancer Screening: (Low Dose CT Chest recommended if Age 58-80 years, 30 pack-year currently smoking OR  have quit w/in 15years.) does not qualify   Additional Screening:  Hepatitis C Screening: Completed 11/14/2019  Vision Screening: Recommended annual ophthalmology exams for early detection of glaucoma and other disorders of the eye. Is the patient up to date with their annual eye exam?  Yes  Who is the provider or what is the name of the office in which the patient attends annual eye exams? Dr. Jimmy Footman   Dental Screening: Recommended annual dental exams for proper oral hygiene  Community Resource Referral / Chronic Care Management: CRR required this visit?  No   CCM required this visit?  No      Plan:     I have personally reviewed and noted the following in the patient's chart:   Medical and social history Use of alcohol, tobacco or illicit drugs  Current medications and supplements including opioid prescriptions. Patient is not currently taking opioid prescriptions. Functional ability and status Nutritional status Physical activity Advanced directives List of other physicians Hospitalizations, surgeries, and ER visits in previous 12 months Vitals Screenings to include cognitive, depression, and falls Referrals and appointments  In addition, I have reviewed and discussed with patient certain preventive protocols, quality metrics, and best practice recommendations. A written personalized care plan for preventive services as well as general preventive health recommendations were provided to patient.   Due to this being a telephonic  visit, the after visit summary with patients personalized plan was offered to patient via mail or my-chart.  Patient would like to access on my-chart.   Roanna Raider, LPN   15/17/6160  Melvenia Beam Health Advisor  Nurse Notes: None

## 2021-03-19 NOTE — Patient Instructions (Signed)
[name] , Thank you for taking time to complete your Medicare Wellness Visit. I appreciate your ongoing commitment to your health goals. Please review the following plan we discussed and let me know if I can assist you in the future.   Screening recommendations/referrals: Colonoscopy: Completed 10/12/2012-Due 10/13/2022 Mammogram: Completed 09/14/2020-Due 09/14/2021 Bone Density: Completed 12/19/2019-Due 12/18/2021 Recommended yearly ophthalmology/optometry visit for glaucoma screening and checkup Recommended yearly dental visit for hygiene and checkup  Vaccinations: Influenza vaccine: Up to date Pneumococcal vaccine: Due-May obtain vaccine at our office or your local pharmacy. Tdap vaccine: Up to date Shingles vaccine: Completed vaccines. Please bring vaccination dates to your next appt.   Covid-19:Up to date  Advanced directives: Please bring a copy of Living Will and/or Healthcare Power of Attorney for your chart.   Conditions/risks identified: See problem list  Next appointment: Follow up in one year for your annual wellness visit 03/21/2022 @ 8:20   Preventive Care 65 Years and Older, Female Preventive care refers to lifestyle choices and visits with your health care provider that can promote health and wellness. What does preventive care include? A yearly physical exam. This is also called an annual well check. Dental exams once or twice a year. Routine eye exams. Ask your health care provider how often you should have your eyes checked. Personal lifestyle choices, including: Daily care of your teeth and gums. Regular physical activity. Eating a healthy diet. Avoiding tobacco and drug use. Limiting alcohol use. Practicing safe sex. Taking low-dose aspirin every day. Taking vitamin and mineral supplements as recommended by your health care provider. What happens during an annual well check? The services and screenings done by your health care provider during your annual  well check will depend on your age, overall health, lifestyle risk factors, and family history of disease. Counseling  Your health care provider may ask you questions about your: Alcohol use. Tobacco use. Drug use. Emotional well-being. Home and relationship well-being. Sexual activity. Eating habits. History of falls. Memory and ability to understand (cognition). Work and work Astronomer. Reproductive health. Screening  You may have the following tests or measurements: Height, weight, and BMI. Blood pressure. Lipid and cholesterol levels. These may be checked every 5 years, or more frequently if you are over 63 years old. Skin check. Lung cancer screening. You may have this screening every year starting at age 93 if you have a 30-pack-year history of smoking and currently smoke or have quit within the past 15 years. Fecal occult blood test (FOBT) of the stool. You may have this test every year starting at age 53. Flexible sigmoidoscopy or colonoscopy. You may have a sigmoidoscopy every 5 years or a colonoscopy every 10 years starting at age 35. Hepatitis C blood test. Hepatitis B blood test. Sexually transmitted disease (STD) testing. Diabetes screening. This is done by checking your blood sugar (glucose) after you have not eaten for a while (fasting). You may have this done every 1-3 years. Bone density scan. This is done to screen for osteoporosis. You may have this done starting at age 41. Mammogram. This may be done every 1-2 years. Talk to your health care provider about how often you should have regular mammograms. Talk with your health care provider about your test results, treatment options, and if necessary, the need for more tests. Vaccines  Your health care provider may recommend certain vaccines, such as: Influenza vaccine. This is recommended every year. Tetanus, diphtheria, and acellular pertussis (Tdap, Td) vaccine. You may need a Td  booster every 10 years. Zoster  vaccine. You may need this after age 85. Pneumococcal 13-valent conjugate (PCV13) vaccine. One dose is recommended after age 16. Pneumococcal polysaccharide (PPSV23) vaccine. One dose is recommended after age 74. Talk to your health care provider about which screenings and vaccines you need and how often you need them. This information is not intended to replace advice given to you by your health care provider. Make sure you discuss any questions you have with your health care provider. Document Released: 05/18/2015 Document Revised: 01/09/2016 Document Reviewed: 02/20/2015 Elsevier Interactive Patient Education  2017 [employer] Prevention in the Home Falls can cause injuries. They can happen to people of all ages. There are many things you can do to make your home safe and to help prevent falls. What can I do on the outside of my home? Regularly fix the edges of walkways and driveways and fix any cracks. Remove anything that might make you trip as you walk through a door, such as a raised step or threshold. Trim any bushes or trees on the path to your home. Use bright outdoor lighting. Clear any walking paths of anything that might make someone trip, such as rocks or tools. Regularly check to see if handrails are loose or broken. Make sure that both sides of any steps have handrails. Any raised decks and porches should have guardrails on the edges. Have any leaves, snow, or ice cleared regularly. Use sand or salt on walking paths during winter. Clean up any spills in your garage right away. This includes oil or grease spills. What can I do in the bathroom? Use night lights. Install grab bars by the toilet and in the tub and shower. Do not use towel bars as grab bars. Use non-skid mats or decals in the tub or shower. If you need to sit down in the shower, use a plastic, non-slip stool. Keep the floor dry. Clean up any water that spills on the floor as soon as it happens. Remove  soap buildup in the tub or shower regularly. Attach bath mats securely with double-sided non-slip rug tape. Do not have throw rugs and other things on the floor that can make you trip. What can I do in the bedroom? Use night lights. Make sure that you have a light by your bed that is easy to reach. Do not use any sheets or blankets that are too big for your bed. They should not hang down onto the floor. Have a firm chair that has side arms. You can use this for support while you get dressed. Do not have throw rugs and other things on the floor that can make you trip. What can I do in the kitchen? Clean up any spills right away. Avoid walking on wet floors. Keep items that you use a lot in easy-to-reach places. If you need to reach something above you, use a strong step stool that has a grab bar. Keep electrical cords out of the way. Do not use floor polish or wax that makes floors slippery. If you must use wax, use non-skid floor wax. Do not have throw rugs and other things on the floor that can make you trip. What can I do with my stairs? Do not leave any items on the stairs. Make sure that there are handrails on both sides of the stairs and use them. Fix handrails that are broken or loose. Make sure that handrails are as long as the stairways. Check any carpeting  to make sure that it is firmly attached to the stairs. Fix any carpet that is loose or worn. Avoid having throw rugs at the top or bottom of the stairs. If you do have throw rugs, attach them to the floor with carpet tape. Make sure that you have a light switch at the top of the stairs and the bottom of the stairs. If you do not have them, ask someone to add them for you. What else can I do to help prevent falls? Wear shoes that: Do not have high heels. Have rubber bottoms. Are comfortable and fit you well. Are closed at the toe. Do not wear sandals. If you use a stepladder: Make sure that it is fully opened. Do not climb a  closed stepladder. Make sure that both sides of the stepladder are locked into place. Ask someone to hold it for you, if possible. Clearly mark and make sure that you can see: Any grab bars or handrails. First and last steps. Where the edge of each step is. Use tools that help you move around (mobility aids) if they are needed. These include: Canes. Walkers. Scooters. Crutches. Turn on the lights when you go into a dark area. Replace any light bulbs as soon as they burn out. Set up your furniture so you have a clear path. Avoid moving your furniture around. If any of your floors are uneven, fix them. If there are any pets around you, be aware of where they are. Review your medicines with your doctor. Some medicines can make you feel dizzy. This can increase your chance of falling. Ask your doctor what other things that you can do to help prevent falls. This information is not intended to replace advice given to you by your health care provider. Make sure you discuss any questions you have with your health care provider. Document Released: 02/15/2009 Document Revised: 09/27/2015 Document Reviewed: 05/26/2014 Elsevier Interactive Patient Education  2017 [employer].

## 2021-04-03 ENCOUNTER — Encounter: Payer: Self-pay | Admitting: Family Medicine

## 2021-04-05 NOTE — Progress Notes (Addendum)
[facility] [address] [place], [place] [address] [phone]  Date:  04/08/2021   Name:  Michelle Rosales   DOB:  1955-05-22   MRN:  [id number]  PCP:  Pearline Cables, MD    Chief Complaint: right hip pain (Pt has had the injection, no real relief. Would like to explore other options. )   History of Present Illness:  Michelle Rosales is a 65 y.o. very pleasant female patient who presents with the following:  Pt seen today for possible hip issue Last visit with myself in October with right hip pain I referred her to ortho and they did a hip injection under fluoro- did not help much beyond a couple of days  Mild degenerative change on x-ray  She sent me the following message: You may remember that you recommended that I see Dr. Case regarding hip pain. I did visit him and after listening to my symptoms, he suggested a cortisone type shot into my hip joint. It didn't help so I'm thinking that the problem is not orthopedic. Some days the pain is very intense especially from my ankle through shin bone to my hip. My sister (a physician in [place]) suggested that I reach out to you again to request blood work or scans to see if there might be other issues.  Shingrix Pneumonia booster - give today  Flu shot and covid done   At this time she feels like the pain is going down the front of her right leg- now down the back No numbness or weakness noted- however she may feel like the leg might not hold her due to the pain on occasion She does have some back pain- it hurts more when she bends over or lies on her back- lower back pain   She has an ancient dog - he sleeps in bed with her.  She has to sleep in a certain position to keep the dog safe in bed- could this contribute to her pain?  She always has to sleep on her side  She is seeing podiatry and got an rx for an ankle brace and new orthotics  Patient Active Problem List    Diagnosis Date Noted   Pre-diabetes 10/24/2020    Past Medical History:  Diagnosis Date   GERD (gastroesophageal reflux disease)     Past Surgical History:  Procedure Laterality Date   ANAL FISSURE REPAIR     BREAST SURGERY  Maybe 6 years ago.   non cancerous lump removed from left breast   COLONOSCOPY WITH PROPOFOL N/A 10/12/2012   Procedure: COLONOSCOPY WITH PROPOFOL;  Surgeon: Charolett Bumpers, MD;  Location: [facility];  Service: Endoscopy;  Laterality: N/A;    Social History   Tobacco Use   Smoking status: Never   Smokeless tobacco: Never  Substance Use Topics   Alcohol use: Yes    Comment: occassionally   Drug use: No    Family History  Problem Relation Age of Onset   Arthritis Mother    Asthma Brother    Cancer Sister    Cancer Sister     Allergies  Allergen Reactions   Venlafaxine Itching    Medication list has been reviewed and updated.  Current Outpatient Medications on File Prior to Visit  Medication Sig Dispense Refill   acetaminophen (TYLENOL) 500 MG tablet Take 500 mg by mouth every 6 (six) hours as needed for pain.  bismuth subsalicylate (PEPTO BISMOL) 262 MG/15ML suspension Take 15 mLs by mouth every 6 (six) hours as needed for indigestion.     Calcium Carbonate (CALTRATE 600 PO) Take 600 mg by mouth daily.     COVID-19 mRNA bivalent vaccine, Pfizer, injection Inject into the muscle. 0.3 mL 0   diclofenac (VOLTAREN) 75 MG EC tablet Take 1 tablet (75 mg total) by mouth 2 (two) times daily as needed. 180 tablet 0   influenza vaccine adjuvanted (FLUAD) 0.5 ML injection Inject into the muscle. 0.5 mL 0   naproxen sodium (ANAPROX) 220 MG tablet Take 220 mg by mouth 2 (two) times daily as needed (for pain).     No current facility-administered medications on file prior to visit.    Review of Systems:  As per HPI- otherwise negative.   Physical Examination: Vitals:   04/08/21 1346  BP: 98/60  Pulse: 75  Resp: 18  Temp: 97.7 F (36.5  C)  SpO2: 100%   Vitals:   04/08/21 1346  Weight: 135 lb (61.2 kg)  Height: 5\' 6"  (1.676 m)   Body mass index is 21.79 kg/m. Ideal Body Weight: Weight in (lb) to have BMI = 25: 154.6  BP Readings from Last 3 Encounters:  04/08/21 98/60  02/20/21 130/84  10/24/20 124/62    GEN: no acute distress.  Looks well, slim build  HEENT: Atraumatic, Normocephalic.  Ears and Nose: No external deformity. CV: RRR, No M/G/R. No JVD. No thrill. No extra heart sounds. PULM: CTA B, no wheezes, crackles, rhonchi. No retractions. No resp. distress. No accessory muscle use. ABD: S, NT, ND. No rebound. No HSM. EXTR: No c/c/e PSYCH: Normally interactive. Conversant.  Good thoracolumbar range of motion.  She has some discomfort with flexed abduction of the right hip, but normal range of motion.  Both calves are soft and nontender.  Normal pulses in the right foot. Normal strength and range of motion, normal deep tendon reflexes both lower extremities  Assessment and Plan: Chronic right-sided low back pain with right-sided sciatica - Plan: DG Lumbar Spine Complete  Pain in right shin - Plan: DG Tibia/Fibula Right  Pain in joint of right hip  Immunization due - Plan: Pneumococcal polysaccharide vaccine 23-valent greater than or equal to 2yo subcutaneous/IM  Patient seen today with concern of right hip pain.  She was noted to have moderate degenerative change on x-ray, saw orthopedics and had intra-articular injection.  She notes this was helpful just for a day or 2 and then pain came back.  Pain SOB running down the front of her right leg.  She has been noted to have degenerative change of her lumbar spine on plain film several years ago  Discussed differential diagnosis with patient.  This still could be a hip issue, she could also potentially have a labral tear inside the hip.  She is worried about a problem with her right shin.  We will obtain plain films of her shin.  Degenerative spine disease  may also be playing a role, will obtain plain films of her lumbar spine  Updated pneumonia vaccine  Will back in touch with patient pending her x-rays  Signed 10/26/20, MD   Addendum 12/6, received her x-ray reports as below  DG Lumbar Spine Complete  Result Date: 04/09/2021 CLINICAL DATA:  Low back pain radiating down right leg. Chronic right-sided low back pain with right-sided sciatica. EXAM: LUMBAR SPINE - COMPLETE 4+ VIEW COMPARISON:  Lumbar radiograph 05/11/2014 FINDINGS: There are 5 lumbar  type vertebra with diminutive ribs at T12. Progressive scoliotic curvature centered at L3-L4. Progressive disc space narrowing and endplate spurring at this level. There is also disc space narrowing and endplate spurring at L2-L3, new. Grade 1 3 mm anterolisthesis of L4 on L5 which is new from prior exam. Mild facet hypertrophy at this level. Vertebral body heights are normal. There is no evidence of fracture, focal bone lesion or bone destruction. The sacroiliac joints are congruent. IMPRESSION: 1. Progressive degenerative disc disease and scoliotic curvature at L3-L4 from 2016. 2. New grade 1 anterolisthesis of L4 on L5, likely facet mediated. Electronically Signed   By: Narda Rutherford M.D.   On: 04/09/2021 13:07   DG Tibia/Fibula Right  Result Date: 04/09/2021 CLINICAL DATA:  Pain in right shin, no known injury.  Chronic pain. EXAM: RIGHT TIBIA AND FIBULA - 2 VIEW COMPARISON:  None. FINDINGS: Small cortical lucency in the medial aspect of the mid distal fibular shaft, unclear if this represents a nutrient channel or small lucent lesion. There is no associated bony destruction or periosteal reaction. Cortical margins of the tibia are intact. No visualized fracture. Knee and ankle alignment are maintained. There is no focal soft tissue abnormality. IMPRESSION: Small cortical lucency in the medial aspect of the mid distal fibular shaft, unclear if this represents a nutrient channel or small lucent  lesion. Consider further evaluation with MRI. Electronically Signed   By: Narda Rutherford M.D.   On: 04/09/2021 13:04

## 2021-04-08 ENCOUNTER — Encounter: Payer: Self-pay | Admitting: Family Medicine

## 2021-04-08 ENCOUNTER — Other Ambulatory Visit: Payer: Self-pay

## 2021-04-08 ENCOUNTER — Ambulatory Visit (INDEPENDENT_AMBULATORY_CARE_PROVIDER_SITE_OTHER): Payer: Medicare HMO | Admitting: Family Medicine

## 2021-04-08 ENCOUNTER — Ambulatory Visit (HOSPITAL_BASED_OUTPATIENT_CLINIC_OR_DEPARTMENT_OTHER)
Admission: RE | Admit: 2021-04-08 | Discharge: 2021-04-08 | Disposition: A | Discharge status: Home / Self Care | Payer: Medicare HMO | Source: Ambulatory Visit | Attending: Family Medicine | Admitting: Family Medicine

## 2021-04-08 VITALS — BP 120/75 | HR 75 | Temp 97.7°F | Resp 18 | Ht 66.0 in | Wt 135.0 lb

## 2021-04-08 DIAGNOSIS — M5441 Lumbago with sciatica, right side: Secondary | ICD-10-CM | POA: Insufficient documentation

## 2021-04-08 DIAGNOSIS — G8929 Other chronic pain: Secondary | ICD-10-CM

## 2021-04-08 DIAGNOSIS — M79661 Pain in right lower leg: Secondary | ICD-10-CM

## 2021-04-08 DIAGNOSIS — M4316 Spondylolisthesis, lumbar region: Secondary | ICD-10-CM | POA: Diagnosis not present

## 2021-04-08 DIAGNOSIS — M25551 Pain in right hip: Secondary | ICD-10-CM

## 2021-04-08 DIAGNOSIS — M545 Low back pain, unspecified: Secondary | ICD-10-CM | POA: Diagnosis not present

## 2021-04-08 DIAGNOSIS — Z23 Encounter for immunization: Secondary | ICD-10-CM | POA: Diagnosis not present

## 2021-04-08 NOTE — Patient Instructions (Signed)
Good to see you today- I will be in touch with your x-rays asap!   Pneumonia booster today and your series is done Consider getting the shingles vaccine at your pharmacy if not done already

## 2021-04-09 ENCOUNTER — Encounter: Payer: Self-pay | Admitting: Family Medicine

## 2021-04-09 DIAGNOSIS — M79661 Pain in right lower leg: Secondary | ICD-10-CM

## 2021-04-09 DIAGNOSIS — G8929 Other chronic pain: Secondary | ICD-10-CM

## 2021-04-09 DIAGNOSIS — M899 Disorder of bone, unspecified: Secondary | ICD-10-CM

## 2021-04-17 ENCOUNTER — Telehealth: Payer: Self-pay | Admitting: Family Medicine

## 2021-04-17 NOTE — Telephone Encounter (Signed)
[facility] called and stated only one mri was approved and they still need lumbar spine mri approved before 12/17. They left number and phone #[phone]. [phone]

## 2021-04-19 ENCOUNTER — Encounter: Payer: Self-pay | Admitting: Family Medicine

## 2021-04-20 ENCOUNTER — Ambulatory Visit (HOSPITAL_BASED_OUTPATIENT_CLINIC_OR_DEPARTMENT_OTHER): Payer: Medicare HMO

## 2021-05-04 ENCOUNTER — Other Ambulatory Visit: Payer: Self-pay

## 2021-05-04 ENCOUNTER — Ambulatory Visit (HOSPITAL_BASED_OUTPATIENT_CLINIC_OR_DEPARTMENT_OTHER)
Admission: RE | Admit: 2021-05-04 | Discharge: 2021-05-04 | Disposition: A | Discharge status: Home / Self Care | Payer: Medicare HMO | Source: Ambulatory Visit | Attending: Family Medicine | Admitting: Family Medicine

## 2021-05-04 DIAGNOSIS — M79661 Pain in right lower leg: Secondary | ICD-10-CM | POA: Insufficient documentation

## 2021-05-04 DIAGNOSIS — M47816 Spondylosis without myelopathy or radiculopathy, lumbar region: Secondary | ICD-10-CM | POA: Diagnosis not present

## 2021-05-04 DIAGNOSIS — M899 Disorder of bone, unspecified: Secondary | ICD-10-CM | POA: Diagnosis not present

## 2021-05-04 DIAGNOSIS — M5441 Lumbago with sciatica, right side: Secondary | ICD-10-CM | POA: Insufficient documentation

## 2021-05-04 DIAGNOSIS — M5126 Other intervertebral disc displacement, lumbar region: Secondary | ICD-10-CM | POA: Diagnosis not present

## 2021-05-04 DIAGNOSIS — G8929 Other chronic pain: Secondary | ICD-10-CM | POA: Diagnosis not present

## 2021-05-04 MED ORDER — GADOBUTROL 1 MMOL/ML IV SOLN
6.0000 mL | Freq: Once | INTRAVENOUS | Status: AC | PRN
  Administered 2021-05-04: 6 mL via INTRAVENOUS

## 2021-05-07 ENCOUNTER — Encounter: Payer: Self-pay | Admitting: Family Medicine

## 2021-05-21 ENCOUNTER — Encounter: Payer: Self-pay | Admitting: Family Medicine

## 2021-05-21 DIAGNOSIS — G8929 Other chronic pain: Secondary | ICD-10-CM

## 2021-05-21 DIAGNOSIS — M5441 Lumbago with sciatica, right side: Secondary | ICD-10-CM

## 2021-05-22 NOTE — Addendum Note (Signed)
Addended by: Abbe Amsterdam C on: 05/22/2021 08:24 PM   Modules accepted: Orders

## 2021-05-27 ENCOUNTER — Other Ambulatory Visit (HOSPITAL_BASED_OUTPATIENT_CLINIC_OR_DEPARTMENT_OTHER): Payer: Self-pay

## 2021-05-28 ENCOUNTER — Other Ambulatory Visit (HOSPITAL_BASED_OUTPATIENT_CLINIC_OR_DEPARTMENT_OTHER): Payer: Self-pay

## 2021-06-11 DIAGNOSIS — M25551 Pain in right hip: Secondary | ICD-10-CM | POA: Diagnosis not present

## 2021-06-11 DIAGNOSIS — M5416 Radiculopathy, lumbar region: Secondary | ICD-10-CM | POA: Diagnosis not present

## 2021-06-14 DIAGNOSIS — M5416 Radiculopathy, lumbar region: Secondary | ICD-10-CM | POA: Diagnosis not present

## 2021-06-21 ENCOUNTER — Other Ambulatory Visit (HOSPITAL_BASED_OUTPATIENT_CLINIC_OR_DEPARTMENT_OTHER): Payer: Self-pay

## 2021-07-01 DIAGNOSIS — M25551 Pain in right hip: Secondary | ICD-10-CM | POA: Diagnosis not present

## 2021-07-10 DIAGNOSIS — M25551 Pain in right hip: Secondary | ICD-10-CM | POA: Diagnosis not present

## 2021-07-11 ENCOUNTER — Other Ambulatory Visit (HOSPITAL_BASED_OUTPATIENT_CLINIC_OR_DEPARTMENT_OTHER): Payer: Self-pay

## 2021-07-15 DIAGNOSIS — M25551 Pain in right hip: Secondary | ICD-10-CM | POA: Diagnosis not present

## 2021-08-12 ENCOUNTER — Other Ambulatory Visit (HOSPITAL_BASED_OUTPATIENT_CLINIC_OR_DEPARTMENT_OTHER): Payer: Self-pay

## 2021-08-14 ENCOUNTER — Encounter: Payer: Self-pay | Admitting: Family Medicine

## 2021-08-14 DIAGNOSIS — R0789 Other chest pain: Secondary | ICD-10-CM | POA: Diagnosis not present

## 2021-08-15 ENCOUNTER — Ambulatory Visit
Admission: EM | Admit: 2021-08-15 | Discharge: 2021-08-15 | Disposition: A | Discharge status: Home / Self Care | Payer: Medicare HMO | Attending: Emergency Medicine | Admitting: Emergency Medicine

## 2021-08-15 ENCOUNTER — Other Ambulatory Visit: Payer: Self-pay

## 2021-08-15 ENCOUNTER — Encounter: Payer: Self-pay | Admitting: Emergency Medicine

## 2021-08-15 DIAGNOSIS — R350 Frequency of micturition: Secondary | ICD-10-CM | POA: Diagnosis not present

## 2021-08-15 DIAGNOSIS — R079 Chest pain, unspecified: Secondary | ICD-10-CM | POA: Insufficient documentation

## 2021-08-15 LAB — POCT URINALYSIS DIP (MANUAL ENTRY)
Bilirubin, UA: NEGATIVE
Blood, UA: NEGATIVE
Glucose, UA: NEGATIVE mg/dL
Leukocytes, UA: NEGATIVE
Nitrite, UA: NEGATIVE
Protein Ur, POC: NEGATIVE mg/dL
Spec Grav, UA: 1.025 (ref 1.010–1.025)
Urobilinogen, UA: 0.2 E.U./dL
pH, UA: 6 (ref 5.0–8.0)

## 2021-08-15 NOTE — Discharge Instructions (Addendum)
At this time, after reviewing the EKG performed by the paramedics and the EKG that was performed here at our clinic today, I am confident that your chest pain is not related to any injury to your heart or heart muscle. ? ?My top 3 differentials today include electrolyte imbalance, which may also contribute to the increased frequency of urination and or muscle spasms, chest wall mass or mass in the breast which can easily be ruled out with mammography which you are due for soon and esophageal spasm, sometimes caused by increased acid exposure to the esophagus but can also be stress related although this does not seem to be a concern for you. ? ?We performed blood tests today, we checked your red and white blood cell count and a second test called a metabolic panel which evaluates your liver, your kidneys, your sugar, your protein levels, and your electrolytes.  The results of the test will be posted to your MyChart account and if there are any abnormal findings, we will reach out to you to let you know if there are any recommendations for treatment. ? ?Your urinalysis today was normal.  Urine culture will be performed per protocol.  If there is an abnormal finding, meaning there is bacteria present in your urine, we will provide you with a prescription for antibiotics for treatment.  Urine culture typically takes 3 to 5 days, please contact us immediately if you find that in addition to urinating more frequently, you begin to have pain during urination or pressure in your lower pelvis so that we can discuss beginning antibiotics before your urine culture result is complete. ? ?Your primary care provider should be able to easily access the results of the tests we performed here today. ? ?Thank you for visiting urgent care today.  I appreciate the opportunity to participate in your care. ?

## 2021-08-15 NOTE — ED Provider Notes (Signed)
?[facility] ? ? ? ?CSN: [id number] ?Arrival date & time: 08/15/21  5465 ?  ? ?HISTORY  ? ?Chief Complaint  ?Patient presents with  ? Chest Pain  ? ?HPI ?Michelle Rosales is a 66 y.o. female. Patient presents to urgent care for evaluation of an intense, 10 out of 10 pain that occurred behind her left breast last night after sitting at rest, 3 hours after eating a nutritious meal of lentil soup, while she was in the process of getting up off the couch to care for her elderly dog.  Patient states she also felt intensely short of breath while the pain was occurring.  States her husband called 911, paramedics came to her home and evaluated her, states while they did not recommend transport they did recommend that she have blood work done, patient states they were not clear what kind of blood work they meant, they were kind enough to provide her with a copy of the rhythm strip they obtained.  Compared to the EKG we performed here at the clinic, there are no changes.  The EKG today demonstrates normal sinus rhythm with questionable right axis deviation which I do not appreciate per my interpretation. ? ?Patient reports not being under any particular stress when this occurred.  Patient states that she has never had this type of pain in the past.  Patient states she gets annual mammograms and is currently due for next mammogram in a month or so, states mammogram is always normal.  Patient states she used to suffer from heartburn years ago but is not aware that it is gotten any worse recently, does not currently take anything for heartburn.  Patient also reports historically low blood pressure, states her pressure was low yesterday evening when the paramedics checked it.  Patient does not recall what it was. ? ?Upon further questioning from the RN during triage, patient added that she has noticed that she has been urinating more frequently.  Urine dip today was normal.  Patient denies GI upset, known sick contacts,  headache, dizziness, fever, aches, chills. ? ?The history is provided by the patient.  ?Past Medical History:  ?Diagnosis Date  ? GERD (gastroesophageal reflux disease)   ? ?Patient Active Problem List  ? Diagnosis Date Noted  ? Pre-diabetes 10/24/2020  ? ?Past Surgical History:  ?Procedure Laterality Date  ? ANAL FISSURE REPAIR    ? BREAST SURGERY  Maybe 6 years ago.  ? non cancerous lump removed from left breast  ? COLONOSCOPY WITH PROPOFOL N/A 10/12/2012  ? Procedure: COLONOSCOPY WITH PROPOFOL;  Surgeon: Charolett Bumpers, MD;  Location: [facility];  Service: Endoscopy;  Laterality: N/A;  ? ?OB History   ?No obstetric history on file. ?  ? ?Home Medications   ? ?Prior to Admission medications   ?Medication Sig Start Date End Date Taking? Authorizing Provider  ?acetaminophen (TYLENOL) 500 MG tablet Take 500 mg by mouth every 6 (six) hours as needed for pain.    [provider]  ?bismuth subsalicylate (PEPTO BISMOL) 262 MG/15ML suspension Take 15 mLs by mouth every 6 (six) hours as needed for indigestion.    [provider]  ?Calcium Carbonate (CALTRATE 600 PO) Take 600 mg by mouth daily.    [provider]  ?COVID-19 mRNA bivalent vaccine, Pfizer, injection Inject into the muscle. 02/06/21   Judyann Munson, MD  ?diclofenac (VOLTAREN) 75 MG EC tablet Take 1 tablet (75 mg total) by mouth 2 (two) times daily as needed. 03/19/21  Copland, Gwenlyn FoundJessica C, MD  ?influenza vaccine adjuvanted (FLUAD) 0.5 ML injection Inject into the muscle. 02/06/21   Judyann MunsonSnider, Cynthia, MD  ?naproxen sodium (ANAPROX) 220 MG tablet Take 220 mg by mouth 2 (two) times daily as needed (for pain).    [provider]  ? ? ?Family History ?Family History  ?Problem Relation Age of Onset  ? Arthritis Mother   ? Asthma Brother   ? Cancer Sister   ? Cancer Sister   ? ?Social History ?Social History  ? ?Tobacco Use  ? Smoking status: Never  ? Smokeless tobacco: Never  ?Substance Use Topics  ? Alcohol use: Yes  ?  Comment:  occassionally  ? Drug use: No  ? ?Allergies   ?Venlafaxine ? ?Review of Systems ?Review of Systems ?Pertinent findings noted in history of present illness.  ? ?Physical Exam ?Triage Vital Signs ?ED Triage Vitals  ?Enc Vitals Group  ?   BP 03/01/21 0827 (!) 147/82  ?   Pulse Rate 03/01/21 0827 72  ?   Resp 03/01/21 0827 18  ?   Temp 03/01/21 0827 98.3 ?F (36.8 ?C)  ?   Temp Source 03/01/21 0827 Oral  ?   SpO2 03/01/21 0827 98 %  ?   Weight --   ?   Height --   ?   Head Circumference --   ?   Peak Flow --   ?   Pain Score 03/01/21 0826 5  ?   Pain Loc --   ?   Pain Edu? --   ?   Excl. in GC? --   ?No data found. ? ?Updated Vital Signs ?BP 104/71 (BP Location: Right Arm)   Pulse 74   Temp 97.9 ?F (36.6 ?C) (Oral)   Resp 16   SpO2 95%  ? ?Physical Exam ?Vitals and nursing note reviewed.  ?Constitutional:   ?   General: She is not in acute distress. ?   Appearance: Normal appearance. She is not ill-appearing.  ?HENT:  ?   Head: Normocephalic and atraumatic.  ?   Salivary Glands: Right salivary gland is not diffusely enlarged or tender. Left salivary gland is not diffusely enlarged or tender.  ?   Right Ear: Tympanic membrane, ear canal and external ear normal. No drainage. No middle ear effusion. There is no impacted cerumen. Tympanic membrane is not erythematous or bulging.  ?   Left Ear: Tympanic membrane, ear canal and external ear normal. No drainage.  No middle ear effusion. There is no impacted cerumen. Tympanic membrane is not erythematous or bulging.  ?   Nose: Nose normal. No nasal deformity, septal deviation, mucosal edema, congestion or rhinorrhea.  ?   Right Turbinates: Not enlarged, swollen or pale.  ?   Left Turbinates: Not enlarged, swollen or pale.  ?   Right Sinus: No maxillary sinus tenderness or frontal sinus tenderness.  ?   Left Sinus: No maxillary sinus tenderness or frontal sinus tenderness.  ?   Mouth/Throat:  ?   Lips: Pink. No lesions.  ?   Mouth: Mucous membranes are moist. No oral lesions.   ?   Pharynx: Oropharynx is clear. Uvula midline. No posterior oropharyngeal erythema or uvula swelling.  ?   Tonsils: No tonsillar exudate. 0 on the right. 0 on the left.  ?Eyes:  ?   General: Lids are normal.     ?   Right eye: No discharge.     ?   Left eye: No discharge.  ?  Extraocular Movements: Extraocular movements intact.  ?   Conjunctiva/sclera: Conjunctivae normal.  ?   Right eye: Right conjunctiva is not injected.  ?   Left eye: Left conjunctiva is not injected.  ?Neck:  ?   Trachea: Trachea and phonation normal.  ?Cardiovascular:  ?   Rate and Rhythm: Regular rhythm.  ?   Pulses: Normal pulses.  ?   Heart sounds: Normal heart sounds. No murmur heard. ?  No friction rub. No gallop.  ?Pulmonary:  ?   Effort: Pulmonary effort is normal. No accessory muscle usage, prolonged expiration or respiratory distress.  ?   Breath sounds: Normal breath sounds. No stridor, decreased air movement or transmitted upper airway sounds. No decreased breath sounds, wheezing, rhonchi or rales.  ?Chest:  ?   Chest wall: No tenderness.  ?Abdominal:  ?   General: Abdomen is flat. Bowel sounds are normal. There is no distension.  ?   Palpations: Abdomen is soft.  ?   Tenderness: There is no abdominal tenderness. There is no right CVA tenderness or left CVA tenderness.  ?   Hernia: No hernia is present.  ?Musculoskeletal:     ?   General: Normal range of motion.  ?   Cervical back: Normal range of motion and neck supple. Normal range of motion.  ?Lymphadenopathy:  ?   Cervical: No cervical adenopathy.  ?Skin: ?   General: Skin is warm and dry.  ?   Findings: No erythema or rash.  ?Neurological:  ?   General: No focal deficit present.  ?   Mental Status: She is alert and oriented to person, place, and time.  ?Psychiatric:     ?   Mood and Affect: Mood normal.     ?   Behavior: Behavior normal.  ? ? ?Visual Acuity ?Right Eye Distance:   ?Left Eye Distance:   ?Bilateral Distance:   ? ?Right Eye Near:   ?Left Eye Near:    ?Bilateral  Near:    ? ?UC Couse / Diagnostics / Procedures:  ?  ?EKG ? ?Radiology ?No results found. ? ?Procedures ?Procedures (including critical care time) ? ?UC Diagnoses / Final Clinical Impressions(s)   ?I h

## 2021-08-15 NOTE — ED Triage Notes (Addendum)
Patient presents to [facility] for evaluation of left breast/chest pain last night that she would have rated 10/10.  She became short of breath and lightheaded, has a hx of hypotension.  EMs came out and cleared her (EKG on counter in room) and everything resolved, but she was told to follow-up with her PCP today.  PCP is out and so she came here.  No pain or SOB at this time .  Patient mentions she has been peeing more than normal (about once an hour) ?

## 2021-08-16 LAB — URINE CULTURE: Culture: NO GROWTH

## 2021-08-17 LAB — COMPREHENSIVE METABOLIC PANEL
ALT: 22 IU/L (ref 0–32)
AST: 29 IU/L (ref 0–40)
Albumin/Globulin Ratio: 2 (ref 1.2–2.2)
Albumin: 4.9 g/dL — ABNORMAL HIGH (ref 3.8–4.8)
Alkaline Phosphatase: 78 IU/L (ref 44–121)
BUN/Creatinine Ratio: 19 (ref 12–28)
BUN: 13 mg/dL (ref 8–27)
Bilirubin Total: 0.3 mg/dL (ref 0.0–1.2)
CO2: 23 mmol/L (ref 20–29)
Calcium: 10 mg/dL (ref 8.7–10.3)
Chloride: 106 mmol/L (ref 96–106)
Creatinine, Ser: 0.68 mg/dL (ref 0.57–1.00)
Globulin, Total: 2.4 g/dL (ref 1.5–4.5)
Glucose: 89 mg/dL (ref 70–99)
Potassium: 4.6 mmol/L (ref 3.5–5.2)
Sodium: 144 mmol/L (ref 134–144)
Total Protein: 7.3 g/dL (ref 6.0–8.5)
eGFR: 95 mL/min/{1.73_m2} (ref >59)

## 2021-08-17 LAB — CBC WITH DIFFERENTIAL/PLATELET
Basophils Absolute: 0 10*3/uL (ref 0.0–0.2)
Basos: 1 %
EOS (ABSOLUTE): 0 10*3/uL (ref 0.0–0.4)
Eos: 1 %
Hematocrit: 41.7 % (ref 34.0–46.6)
Hemoglobin: 13.6 g/dL (ref 11.1–15.9)
Immature Grans (Abs): 0 10*3/uL (ref 0.0–0.1)
Immature Granulocytes: 1 %
Lymphocytes Absolute: 1.1 10*3/uL (ref 0.7–3.1)
Lymphs: 20 %
MCH: 32.1 pg (ref 26.6–33.0)
MCHC: 32.6 g/dL (ref 31.5–35.7)
MCV: 98 fL — ABNORMAL HIGH (ref 79–97)
Monocytes Absolute: 0.4 10*3/uL (ref 0.1–0.9)
Monocytes: 7 %
Neutrophils Absolute: 4 10*3/uL (ref 1.4–7.0)
Neutrophils: 70 %
Platelets: 210 10*3/uL (ref 150–450)
RBC: 4.24 x10E6/uL (ref 3.77–5.28)
RDW: 13.6 % (ref 11.7–15.4)
WBC: 5.7 10*3/uL (ref 3.4–10.8)

## 2021-08-22 ENCOUNTER — Other Ambulatory Visit (HOSPITAL_BASED_OUTPATIENT_CLINIC_OR_DEPARTMENT_OTHER): Payer: Self-pay

## 2021-08-27 NOTE — Progress Notes (Signed)
[facility] conservation [facility] at [employer] ?[address], [address] ?[place], [place] [address] ?336 [phone] ?Fax [phone] ? ?Date:  09/02/2021  ? ?Name:  Michelle Rosales   DOB:  02-Jun-1955   MRN:  [id number] ? ?PCP:  Pearline Cables, MD  ? ? ?Chief Complaint: UC follow up (Pt was seen at [facility] on 08/15/21 for chest pain. /Concerns/ questions: none/) ? ? ?History of Present Illness: ? ?Michelle Rosales is a 66 y.o. very pleasant female patient who presents with the following: ? ?I last saw Tiasha in December with concern of hip pain ?Patient with history of prediabetes and GERD, here today for follow-up from recent urgent care visit ?She was seen in urgent care on April 13 with chest pain-patient developed severe left chest pain while at rest, EMS came and evaluated her.  Patient contacted me, stating that EMS wanted her to have "blood work done", I advised further evaluation may be warranted and asked her to seek immediate care ? ?At urgent care she was noted to have normal EKG, cardiac enzymes were not completed but CBC and CMP reassuring ?Chest x-ray was not done ? ?Pt notes she had a severe, tight feeling pain in her left chest/ behind the breast. She felt like she might have syncope.  The pain lasted for about 10 minutes overall ?Never had before or since  ? ?Pt noes urinary frequency for 3-4 months ?No dysuria ?No hematuria ?She also notes both urge and stress incontinecne  ? ?Her hip is holding her back from the hiking she likes to do -we discussed doing a stress test, she does not feel she could walk up an incline ? ?Patient Active Problem List  ? Diagnosis Date Noted  ? Pre-diabetes 10/24/2020  ? ? ?Past Medical History:  ?Diagnosis Date  ? GERD (gastroesophageal reflux disease)   ? ? ?Past Surgical History:  ?Procedure Laterality Date  ? ANAL FISSURE REPAIR    ? BREAST SURGERY  Maybe 6 years ago.  ? non cancerous lump removed from left breast  ? COLONOSCOPY WITH PROPOFOL N/A 10/12/2012  ? Procedure:  COLONOSCOPY WITH PROPOFOL;  Surgeon: Charolett Bumpers, MD;  Location: [facility];  Service: Endoscopy;  Laterality: N/A;  ? ? ?Social History  ? ?Tobacco Use  ? Smoking status: Never  ? Smokeless tobacco: Never  ?Substance Use Topics  ? Alcohol use: Yes  ?  Comment: occassionally  ? Drug use: No  ? ? ?Family History  ?Problem Relation Age of Onset  ? Arthritis Mother   ? Asthma Brother   ? Cancer Sister   ? Cancer Sister   ? ? ?Allergies  ?Allergen Reactions  ? Venlafaxine Itching  ? ? ?Medication list has been reviewed and updated. ? ?Current Outpatient Medications on File Prior to Visit  ?Medication Sig Dispense Refill  ? acetaminophen (TYLENOL) 500 MG tablet Take 500 mg by mouth every 6 (six) hours as needed for pain.    ? bismuth subsalicylate (PEPTO BISMOL) 262 MG/15ML suspension Take 15 mLs by mouth every 6 (six) hours as needed for indigestion.    ? Calcium Carbonate (CALTRATE 600 PO) Take 600 mg by mouth daily.    ? diclofenac (VOLTAREN) 75 MG EC tablet Take 1 tablet (75 mg total) by mouth 2 (two) times daily as needed. 180 tablet 0  ? naproxen sodium (ANAPROX) 220 MG tablet Take 220 mg by mouth 2 (two) times daily as needed (for pain).    ? ?  No current facility-administered medications on file prior to visit.  ? ? ?Review of Systems: ? ?As per HPI- otherwise negative. ? ?BP Readings from Last 3 Encounters:  ?09/02/21 100/60  ?08/15/21 104/71  ?04/08/21 120/75  ? ? ? ? ?Physical Examination: ?Vitals:  ? 09/02/21 0918  ?BP: 100/60  ?Pulse: 73  ?Resp: 18  ?Temp: 97.8 ?F (36.6 ?C)  ?SpO2: 97%  ? ?Vitals:  ? 09/02/21 0918  ?Weight: 134 lb 6.4 oz (61 kg)  ?Height: 5\' 6"  (1.676 m)  ? ?Body mass index is 21.69 kg/m?. ?Ideal Body Weight: Weight in (lb) to have BMI = 25: 154.6 ? ?GEN: no acute distress.  Slender build, looks well ?HEENT: Atraumatic, Normocephalic.  Bilateral TM wnl, oropharynx normal.  PEERL,EOMI.   ?Ears and Nose: No external deformity. ?CV: RRR, No M/G/R. No JVD. No thrill. No extra heart  sounds. ?PULM: CTA B, no wheezes, crackles, rhonchi. No retractions. No resp. distress. No accessory muscle use. ?ABD: S, NT, ND. No rebound. No HSM. ?EXTR: No c/c/e ?PSYCH: Normally interactive. Conversant.  ? ? ?Assessment and Plan: ?Chest pain, unspecified type - Plan: DG Chest 2 View, Myocardial Perfusion Imaging, Cardiac Stress Test: Informed Consent Details: Physician/Practitioner Attestation; Transcribe to consent form and obtain patient signature ? ?Urinary frequency - Plan: tolterodine (DETROL LA) 4 MG 24 hr capsule, TSH ? ?Right hip pain - Plan: diclofenac (VOLTAREN) 75 MG EC tablet ? ?Pre-diabetes - Plan: Hemoglobin A1c ? ?Screening for hyperlipidemia - Plan: Lipid panel ? ?Patient seen today for follow-up, she recently had 1 episode of chest pain which occurred at rest.  She was evaluated at home by EMS and also at urgent care; all reassuring so far ?We discussed options for further evaluation-cardiology visit versus stress testing.  Patient would prefer to go right to stress test ?Referral for Myoview placed today ?Report any other episodes of chest pain ?Refill Voltaren which she uses as needed for hip pain ?Lab work is pending ?We will have her try Detrol LA for urinary frequency and incontinence ?Signed ? , MD ?Received her CXR- message to pt ? ?DG Chest 2 View ? ?Result Date: 09/02/2021 ?CLINICAL DATA:  chest pain, rule out hiatal hernia EXAM: CHEST - 2 VIEW COMPARISON:  None. FINDINGS: No consolidation. No visible pleural effusions or pneumothorax. No visible hiatal hernia. Cardiomediastinal silhouette is within normal limits. No displaced fracture. Mild reverse S-shaped thoracic curvature. IMPRESSION: No active cardiopulmonary disease. Electronically Signed   By: 11/02/2021 M.D.   On: 09/02/2021 10:14   ? ? ?Received her labs as follows- message to pt ? ?Results for orders placed or performed in visit on 09/02/21  ?Hemoglobin A1c  ?Result Value Ref Range  ? Hgb A1c MFr Bld 5.9  4.6 - 6.5 %  ?Lipid panel  ?Result Value Ref Range  ? Cholesterol 215 (H) 0 - 200 mg/dL  ? Triglycerides 118.0 0.0 - 149.0 mg/dL  ? HDL 68.10 >39.00 mg/dL  ? VLDL 23.6 0.0 - 40.0 mg/dL  ? LDL Cholesterol 124 (H) 0 - 99 mg/dL  ? Total CHOL/HDL Ratio 3   ? NonHDL 147.26   ?TSH  ?Result Value Ref Range  ? TSH 1.05 0.35 - 5.50 uIU/mL  ? ? ? ?

## 2021-09-02 ENCOUNTER — Ambulatory Visit (HOSPITAL_BASED_OUTPATIENT_CLINIC_OR_DEPARTMENT_OTHER)
Admission: RE | Admit: 2021-09-02 | Discharge: 2021-09-02 | Disposition: A | Discharge status: Home / Self Care | Payer: Medicare HMO | Source: Ambulatory Visit | Attending: Family Medicine | Admitting: Family Medicine

## 2021-09-02 ENCOUNTER — Encounter: Payer: Self-pay | Admitting: Family Medicine

## 2021-09-02 ENCOUNTER — Other Ambulatory Visit (HOSPITAL_BASED_OUTPATIENT_CLINIC_OR_DEPARTMENT_OTHER): Payer: Self-pay

## 2021-09-02 ENCOUNTER — Ambulatory Visit (INDEPENDENT_AMBULATORY_CARE_PROVIDER_SITE_OTHER): Payer: Medicare HMO | Admitting: Family Medicine

## 2021-09-02 VITALS — BP 100/60 | HR 73 | Temp 97.8°F | Resp 18 | Ht 66.0 in | Wt 134.4 lb

## 2021-09-02 DIAGNOSIS — R079 Chest pain, unspecified: Secondary | ICD-10-CM | POA: Insufficient documentation

## 2021-09-02 DIAGNOSIS — Z13220 Encounter for screening for lipoid disorders: Secondary | ICD-10-CM

## 2021-09-02 DIAGNOSIS — R7303 Prediabetes: Secondary | ICD-10-CM

## 2021-09-02 DIAGNOSIS — M25551 Pain in right hip: Secondary | ICD-10-CM

## 2021-09-02 DIAGNOSIS — R350 Frequency of micturition: Secondary | ICD-10-CM

## 2021-09-02 LAB — LIPID PANEL
Cholesterol: 215 mg/dL — ABNORMAL HIGH (ref 0–200)
HDL: 68.1 mg/dL (ref >39.00)
LDL Cholesterol: 124 mg/dL — ABNORMAL HIGH (ref 0–99)
NonHDL: 147.26
Total CHOL/HDL Ratio: 3
Triglycerides: 118 mg/dL (ref 0.0–149.0)
VLDL: 23.6 mg/dL (ref 0.0–40.0)

## 2021-09-02 LAB — HEMOGLOBIN A1C: Hgb A1c MFr Bld: 5.9 % (ref 4.6–6.5)

## 2021-09-02 LAB — TSH: TSH: 1.05 u[IU]/mL (ref 0.35–5.50)

## 2021-09-02 MED ORDER — TOLTERODINE TARTRATE ER 4 MG PO CP24: 4.0000 mg | ORAL_CAPSULE | Freq: Every day | ORAL | 6 refills | Status: DC

## 2021-09-02 MED ORDER — DICLOFENAC SODIUM 75 MG PO TBEC: 75.0000 mg | DELAYED_RELEASE_TABLET | Freq: Two times a day (BID) | ORAL | 1 refills | Status: DC | PRN

## 2021-09-02 NOTE — Patient Instructions (Signed)
It was good to see you again today- we will get a stress test to check on your heart ?I will be in touch with your labs and chest x-ray asap ? ?Try the detrol LA for your bladder- let me know if you think helpful for you ? ?

## 2021-09-03 ENCOUNTER — Telehealth: Payer: Self-pay

## 2021-09-03 NOTE — Telephone Encounter (Signed)
Myocardial Perfusion Imaging ? ?Approval dates: 09/02/21 through 03/01/22 ?Approval number: [id number] ?

## 2021-09-06 ENCOUNTER — Telehealth (HOSPITAL_COMMUNITY): Payer: Self-pay | Admitting: *Deleted

## 2021-09-06 NOTE — Telephone Encounter (Signed)
Close encounter 

## 2021-09-09 ENCOUNTER — Ambulatory Visit: Payer: Medicare HMO | Attending: Internal Medicine

## 2021-09-09 DIAGNOSIS — Z23 Encounter for immunization: Secondary | ICD-10-CM

## 2021-09-09 NOTE — Progress Notes (Signed)
? ?  Covid-19 Vaccination Clinic ? ?Name:  Michelle Rosales    ?MRN: [id number] ?DOB: [birth year] ? ?09/09/2021 ? ?Michelle Rosales was observed post Covid-19 immunization for 15 minutes without incident. She was provided with Vaccine Information Sheet and instruction to access the V-Safe system.  ? ?Michelle Rosales was instructed to call 911 with any severe reactions post vaccine: ?Difficulty breathing  ?Swelling of face and throat  ?A fast heartbeat  ?A bad rash all over body  ?Dizziness and weakness  ? ?Immunizations Administered   ? ? Name Date Dose VIS Date Route  ? Art gallery manager Booster 09/09/2021  9:12 AM 0.3 mL 01/02/2021 Intramuscular  ? Manufacturer: Pf[employer]  ? Lot: [id number]  ? NDC: [phone]  ? ?  ? ? ?

## 2021-09-10 ENCOUNTER — Ambulatory Visit (HOSPITAL_COMMUNITY)
Admission: RE | Admit: 2021-09-10 | Discharge: 2021-09-10 | Disposition: A | Discharge status: Home / Self Care | Payer: Medicare HMO | Source: Ambulatory Visit | Attending: Cardiology | Admitting: Cardiology

## 2021-09-10 ENCOUNTER — Encounter: Payer: Self-pay | Admitting: Family Medicine

## 2021-09-10 DIAGNOSIS — R079 Chest pain, unspecified: Secondary | ICD-10-CM | POA: Diagnosis not present

## 2021-09-10 LAB — MYOCARDIAL PERFUSION IMAGING
LV dias vol: 84 mL (ref 46–106)
LV sys vol: 31 mL
Nuc Stress EF: 63 %
Peak HR: 98 {beats}/min
Rest HR: 69 {beats}/min
Rest Nuclear Isotope Dose: 10.9 mCi
SDS: 1
SRS: 2
SSS: 3
ST Depression (mm): 0 mm
Stress Nuclear Isotope Dose: 31.8 mCi
TID: 1.06

## 2021-09-10 MED ORDER — TECHNETIUM TC 99M TETROFOSMIN IV KIT
10.9000 | PACK | Freq: Once | INTRAVENOUS | Status: AC | PRN
  Administered 2021-09-10: 10.9 via INTRAVENOUS
  Filled 2021-09-10: qty 11

## 2021-09-10 MED ORDER — TECHNETIUM TC 99M TETROFOSMIN IV KIT
31.8000 | PACK | Freq: Once | INTRAVENOUS | Status: AC | PRN
  Administered 2021-09-10: 31.8 via INTRAVENOUS
  Filled 2021-09-10: qty 32

## 2021-09-10 MED ORDER — REGADENOSON 0.4 MG/5ML IV SOLN
0.4000 mg | Freq: Once | INTRAVENOUS | Status: AC
  Administered 2021-09-10: 0.4 mg via INTRAVENOUS

## 2021-09-12 ENCOUNTER — Other Ambulatory Visit (HOSPITAL_BASED_OUTPATIENT_CLINIC_OR_DEPARTMENT_OTHER): Payer: Self-pay

## 2021-09-12 MED ORDER — PFIZER COVID-19 VAC BIVALENT 30 MCG/0.3ML IM SUSP
INTRAMUSCULAR | 0 refills | Status: DC
  Filled 2021-09-12: qty 0.3, 1d supply, fill #0

## 2021-09-16 DIAGNOSIS — H43393 Other vitreous opacities, bilateral: Secondary | ICD-10-CM | POA: Diagnosis not present

## 2021-09-16 DIAGNOSIS — H5203 Hypermetropia, bilateral: Secondary | ICD-10-CM | POA: Diagnosis not present

## 2021-09-16 DIAGNOSIS — H40003 Preglaucoma, unspecified, bilateral: Secondary | ICD-10-CM | POA: Diagnosis not present

## 2021-09-16 DIAGNOSIS — H2513 Age-related nuclear cataract, bilateral: Secondary | ICD-10-CM | POA: Diagnosis not present

## 2021-09-16 DIAGNOSIS — H524 Presbyopia: Secondary | ICD-10-CM | POA: Diagnosis not present

## 2021-09-16 DIAGNOSIS — H47393 Other disorders of optic disc, bilateral: Secondary | ICD-10-CM | POA: Diagnosis not present

## 2021-09-16 DIAGNOSIS — H52203 Unspecified astigmatism, bilateral: Secondary | ICD-10-CM | POA: Diagnosis not present

## 2021-09-17 DIAGNOSIS — Z0100 Encounter for examination of eyes and vision without abnormal findings: Secondary | ICD-10-CM | POA: Diagnosis not present

## 2021-10-03 ENCOUNTER — Other Ambulatory Visit (HOSPITAL_BASED_OUTPATIENT_CLINIC_OR_DEPARTMENT_OTHER): Payer: Self-pay

## 2021-10-07 DIAGNOSIS — Z1231 Encounter for screening mammogram for malignant neoplasm of breast: Secondary | ICD-10-CM | POA: Diagnosis not present

## 2021-10-08 LAB — HM MAMMOGRAPHY

## 2021-11-04 ENCOUNTER — Other Ambulatory Visit (HOSPITAL_BASED_OUTPATIENT_CLINIC_OR_DEPARTMENT_OTHER): Payer: Self-pay

## 2021-11-25 ENCOUNTER — Other Ambulatory Visit (HOSPITAL_BASED_OUTPATIENT_CLINIC_OR_DEPARTMENT_OTHER): Payer: Self-pay

## 2022-01-10 ENCOUNTER — Other Ambulatory Visit (HOSPITAL_BASED_OUTPATIENT_CLINIC_OR_DEPARTMENT_OTHER): Payer: Self-pay

## 2022-01-10 MED ORDER — INFLUENZA VAC A&B SA ADJ QUAD 0.5 ML IM PRSY
PREFILLED_SYRINGE | INTRAMUSCULAR | 0 refills | Status: DC
  Filled 2022-01-10: qty 0.5, 1d supply, fill #0

## 2022-02-17 DIAGNOSIS — M25551 Pain in right hip: Secondary | ICD-10-CM | POA: Diagnosis not present

## 2022-02-27 ENCOUNTER — Other Ambulatory Visit (HOSPITAL_BASED_OUTPATIENT_CLINIC_OR_DEPARTMENT_OTHER): Payer: Self-pay

## 2022-03-21 ENCOUNTER — Ambulatory Visit: Payer: Medicare HMO

## 2022-03-21 ENCOUNTER — Ambulatory Visit (INDEPENDENT_AMBULATORY_CARE_PROVIDER_SITE_OTHER): Payer: Medicare HMO | Admitting: *Deleted

## 2022-03-21 DIAGNOSIS — Z0000 Encounter for general adult medical examination without abnormal findings: Secondary | ICD-10-CM

## 2022-03-21 NOTE — Patient Instructions (Signed)
Michelle Rosales , Thank you for taking time to come for your Medicare Wellness Visit. I appreciate your ongoing commitment to your health goals. Please review the following plan we discussed and let me know if I can assist you in the future.   These are the goals we discussed:  Goals      Patient Stated     Drink more water        This is a list of the screening recommended for you and due dates:  Health Maintenance  Topic Date Due   Flu Shot  12/03/2021   Colon Cancer Screening  10/13/2022   Medicare Annual Wellness Visit  03/22/2023   Mammogram  10/09/2023   Pneumonia Vaccine  Completed   DEXA scan (bone density measurement)  Completed   COVID-19 Vaccine  Completed   Hepatitis C Screening: USPSTF Recommendation to screen - Ages 78-79 yo.  Completed   Zoster (Shingles) Vaccine  Completed   HPV Vaccine  Aged Out     Next appointment: Follow up in one year for your annual wellness visit    Preventive Care 65 Years and Older, Female Preventive care refers to lifestyle choices and visits with your health care provider that can promote health and wellness. What does preventive care include? A yearly physical exam. This is also called an annual well check. Dental exams once or twice a year. Routine eye exams. Ask your health care provider how often you should have your eyes checked. Personal lifestyle choices, including: Daily care of your teeth and gums. Regular physical activity. Eating a healthy diet. Avoiding tobacco and drug use. Limiting alcohol use. Practicing safe sex. Taking low-dose aspirin every day. Taking vitamin and mineral supplements as recommended by your health care provider. What happens during an annual well check? The services and screenings done by your health care provider during your annual well check will depend on your age, overall health, lifestyle risk factors, and family history of disease. Counseling  Your health care provider may ask you  questions about your: Alcohol use. Tobacco use. Drug use. Emotional well-being. Home and relationship well-being. Sexual activity. Eating habits. History of falls. Memory and ability to understand (cognition). Work and work Astronomer. Reproductive health. Screening  You may have the following tests or measurements: Height, weight, and BMI. Blood pressure. Lipid and cholesterol levels. These may be checked every 5 years, or more frequently if you are over 66 years old. Skin check. Lung cancer screening. You may have this screening every year starting at age 24 if you have a 30-pack-year history of smoking and currently smoke or have quit within the past 15 years. Fecal occult blood test (FOBT) of the stool. You may have this test every year starting at age 70. Flexible sigmoidoscopy or colonoscopy. You may have a sigmoidoscopy every 5 years or a colonoscopy every 10 years starting at age 47. Hepatitis C blood test. Hepatitis B blood test. Sexually transmitted disease (STD) testing. Diabetes screening. This is done by checking your blood sugar (glucose) after you have not eaten for a while (fasting). You may have this done every 1-3 years. Bone density scan. This is done to screen for osteoporosis. You may have this done starting at age 20. Mammogram. This may be done every 1-2 years. Talk to your health care provider about how often you should have regular mammograms. Talk with your health care provider about your test results, treatment options, and if necessary, the need for more tests. Vaccines  Your  health care provider may recommend certain vaccines, such as: Influenza vaccine. This is recommended every year. Tetanus, diphtheria, and acellular pertussis (Tdap, Td) vaccine. You may need a Td booster every 10 years. Zoster vaccine. You may need this after age 64. Pneumococcal 13-valent conjugate (PCV13) vaccine. One dose is recommended after age 53. Pneumococcal polysaccharide  (PPSV23) vaccine. One dose is recommended after age 33. Talk to your health care provider about which screenings and vaccines you need and how often you need them. This information is not intended to replace advice given to you by your health care provider. Make sure you discuss any questions you have with your health care provider. Document Released: 05/18/2015 Document Revised: 01/09/2016 Document Reviewed: 02/20/2015 Elsevier Interactive Patient Education  2017 [place] Prevention in the Home Falls can cause injuries. They can happen to people of all ages. There are many things you can do to make your home safe and to help prevent falls. What can I do on the outside of my home? Regularly fix the edges of walkways and driveways and fix any cracks. Remove anything that might make you trip as you walk through a door, such as a raised step or threshold. Trim any bushes or trees on the path to your home. Use bright outdoor lighting. Clear any walking paths of anything that might make someone trip, such as rocks or tools. Regularly check to see if handrails are loose or broken. Make sure that both sides of any steps have handrails. Any raised decks and porches should have guardrails on the edges. Have any leaves, snow, or ice cleared regularly. Use sand or salt on walking paths during winter. Clean up any spills in your garage right away. This includes oil or grease spills. What can I do in the bathroom? Use night lights. Install grab bars by the toilet and in the tub and shower. Do not use towel bars as grab bars. Use non-skid mats or decals in the tub or shower. If you need to sit down in the shower, use a plastic, non-slip stool. Keep the floor dry. Clean up any water that spills on the floor as soon as it happens. Remove soap buildup in the tub or shower regularly. Attach bath mats securely with double-sided non-slip rug tape. Do not have throw rugs and other things on the  floor that can make you trip. What can I do in the bedroom? Use night lights. Make sure that you have a light by your bed that is easy to reach. Do not use any sheets or blankets that are too big for your bed. They should not hang down onto the floor. Have a firm chair that has side arms. You can use this for support while you get dressed. Do not have throw rugs and other things on the floor that can make you trip. What can I do in the kitchen? Clean up any spills right away. Avoid walking on wet floors. Keep items that you use a lot in easy-to-reach places. If you need to reach something above you, use a strong step stool that has a grab bar. Keep electrical cords out of the way. Do not use floor polish or wax that makes floors slippery. If you must use wax, use non-skid floor wax. Do not have throw rugs and other things on the floor that can make you trip. What can I do with my stairs? Do not leave any items on the stairs. Make sure that there are handrails  on both sides of the stairs and use them. Fix handrails that are broken or loose. Make sure that handrails are as long as the stairways. Check any carpeting to make sure that it is firmly attached to the stairs. Fix any carpet that is loose or worn. Avoid having throw rugs at the top or bottom of the stairs. If you do have throw rugs, attach them to the floor with carpet tape. Make sure that you have a light switch at the top of the stairs and the bottom of the stairs. If you do not have them, ask someone to add them for you. What else can I do to help prevent falls? Wear shoes that: Do not have high heels. Have rubber bottoms. Are comfortable and fit you well. Are closed at the toe. Do not wear sandals. If you use a stepladder: Make sure that it is fully opened. Do not climb a closed stepladder. Make sure that both sides of the stepladder are locked into place. Ask someone to hold it for you, if possible. Clearly mark and make  sure that you can see: Any grab bars or handrails. First and last steps. Where the edge of each step is. Use tools that help you move around (mobility aids) if they are needed. These include: Canes. Walkers. Scooters. Crutches. Turn on the lights when you go into a dark area. Replace any light bulbs as soon as they burn out. Set up your furniture so you have a clear path. Avoid moving your furniture around. If any of your floors are uneven, fix them. If there are any pets around you, be aware of where they are. Review your medicines with your doctor. Some medicines can make you feel dizzy. This can increase your chance of falling. Ask your doctor what other things that you can do to help prevent falls. This information is not intended to replace advice given to you by your health care provider. Make sure you discuss any questions you have with your health care provider. Document Released: 02/15/2009 Document Revised: 09/27/2015 Document Reviewed: 05/26/2014 Elsevier Interactive Patient Education  2017 [employer].

## 2022-03-21 NOTE — Progress Notes (Cosign Needed)
Subjective:   Michelle Rosales is a 66 y.o. female who presents for Medicare Annual (Subsequent) preventive examination.  I connected with  Leeanne Rio on 03/21/22 by a audio enabled telemedicine application and verified that I am speaking with the correct person using two identifiers.  Patient Location: Home  Provider Location: Office/Clinic  I discussed the limitations of evaluation and management by telemedicine. The patient expressed understanding and agreed to proceed.   Review of Systems    Defer to PCP Cardiac Risk Factors include: advanced age (>10men, >3 women)     Objective:    Today's Vitals   03/21/22 1422  PainSc: 4    There is no height or weight on file to calculate BMI.     03/21/2022    2:25 PM 03/19/2021    3:12 PM 10/12/2012   12:48 PM 09/22/2012   11:13 AM  Advanced Directives  Does Patient Have a Medical Advance Directive? Yes Yes Patient has advance directive, copy not in chart Patient has advance directive, copy not in chart  Type of Advance Directive [place];Living will [place];Living will Healthcare Power of [place];Living will  Does patient want to make changes to medical advance directive? No - Patient declined     Copy of [place] in Chart? No - copy requested No - copy requested Copy requested from family Copy requested from family    Current Medications (verified) Outpatient Encounter Medications as of 03/21/2022  Medication Sig   acetaminophen (TYLENOL) 500 MG tablet Take 500 mg by mouth every 6 (six) hours as needed for pain.   bismuth subsalicylate (PEPTO BISMOL) 262 MG/15ML suspension Take 15 mLs by mouth every 6 (six) hours as needed for indigestion.   Calcium Carbonate (CALTRATE 600 PO) Take 600 mg by mouth daily.   COVID-19 mRNA bivalent vaccine, Pfizer, (PFIZER COVID-19 VAC BIVALENT) injection Inject into the muscle.   diclofenac  (VOLTAREN) 75 MG EC tablet Take 1 tablet (75 mg total) by mouth 2 (two) times daily as needed.   influenza vaccine adjuvanted (FLUAD) 0.5 ML injection Inject into the muscle.   naproxen sodium (ANAPROX) 220 MG tablet Take 220 mg by mouth 2 (two) times daily as needed (for pain).   [DISCONTINUED] tolterodine (DETROL LA) 4 MG 24 hr capsule Take 1 capsule (4 mg total) by mouth daily.   No facility-administered encounter medications on file as of 03/21/2022.    Allergies (verified) Venlafaxine   History: Past Medical History:  Diagnosis Date   GERD (gastroesophageal reflux disease)    Past Surgical History:  Procedure Laterality Date   ANAL FISSURE REPAIR     BREAST SURGERY  Maybe 6 years ago.   non cancerous lump removed from left breast   COLONOSCOPY WITH PROPOFOL N/A 10/12/2012   Procedure: COLONOSCOPY WITH PROPOFOL;  Surgeon: Garlan Fair, MD;  Location: [facility];  Service: Endoscopy;  Laterality: N/A;   Family History  Problem Relation Age of Onset   Arthritis Mother    Asthma Brother    Cancer Sister    Cancer Sister    Social History   Socioeconomic History   Marital status: Married    Spouse name: Not on file   Number of children: Not on file   Years of education: Not on file   Highest education level: Not on file  Occupational History   Not on file  Tobacco Use   Smoking status: Never  Smokeless tobacco: Never  Substance and Sexual Activity   Alcohol use: Yes    Comment: occassionally   Drug use: No   Sexual activity: Not on file  Other Topics Concern   Not on file  Social History Narrative   Not on file   Social Determinants of Health   Financial Resource Strain: Low Risk  (03/19/2021)   Overall Financial Resource Strain (CARDIA)    Difficulty of Paying Living Expenses: Not hard at all  Food Insecurity: No Food Insecurity (03/21/2022)   Hunger Vital Sign    Worried About Running Out of Food in the Last Year: Never true    Ran Out of Food in  the Last Year: Never true  Transportation Needs: No Transportation Needs (03/19/2021)   PRAPARE - Hydrologist (Medical): No    Lack of Transportation (Non-Medical): No  Physical Activity: Sufficiently Active (03/19/2021)   Exercise Vital Sign    Days of Exercise per Week: 7 days    Minutes of Exercise per Session: 60 min  Stress: No Stress Concern Present (03/19/2021)   [place]    Feeling of Stress : Not at all  Social Connections: Moderately Integrated (03/19/2021)   Social Connection and Isolation Panel [NHANES]    Frequency of Communication with Friends and Family: More than three times a week    Frequency of Social Gatherings with Friends and Family: More than three times a week    Attends Religious Services: More than 4 times per year    Active Member of [employer] or Organizations: No    Attends Music therapist: Never    Marital Status: Married    Tobacco Counseling Counseling given: Not Answered   Clinical Intake:  Pre-visit preparation completed: Yes  Pain : 0-10 Pain Score: 4  Pain Location: Hip Pain Orientation: Right Pain Descriptors / Indicators: Aching Pain Onset: More than a month ago Pain Frequency: Constant  Diabetes: No  How often do you need to have someone help you when you read instructions, pamphlets, or other written materials from your doctor or pharmacy?: 1 - Never  Activities of Daily Living    03/21/2022    2:26 PM  In your present state of health, do you have any difficulty performing the following activities:  Hearing? 0  Vision? 0  Difficulty concentrating or making decisions? 0  Walking or climbing stairs? 1  Dressing or bathing? 0  Doing errands, shopping? 0  Preparing Food and eating ? N  Using the Toilet? N  In the past six months, have you accidently leaked urine? Y  Comment wears a pantyliner  Do you have problems with  loss of bowel control? N  Managing your Medications? N  Managing your Finances? N  Housekeeping or managing your Housekeeping? N    Patient Care Team: Copland, Gay Filler, MD as PCP - General (Family Medicine)  Indicate any recent Medical Services you may have received from other than [facility] providers in the past year (date may be approximate).     Assessment:   This is a routine wellness examination for Michelle Rosales.  Hearing/Vision screen No results found.  Dietary issues and exercise activities discussed: Current Exercise Habits: Home exercise routine, Type of exercise: walking, Time (Minutes): 35, Frequency (Times/Week): 7, Weekly Exercise (Minutes/Week): 245, Intensity: Mild, Exercise limited by: orthopedic condition(s)   Goals Addressed   None    Depression Screen    03/21/2022  2:25 PM 03/19/2021    3:17 PM 10/24/2020    9:43 AM  PHQ 2/9 Scores  PHQ - 2 Score 0 0 0    Fall Risk    03/21/2022    2:26 PM 03/19/2021    3:14 PM 10/24/2020    9:42 AM  [place] in the past year? 0 0 0  Number falls in past yr: 0 0   Injury with Fall? 0 0   Risk for fall due to : No Fall Risks    Follow up Falls evaluation completed Falls prevention discussed     FALL RISK PREVENTION PERTAINING TO THE HOME:  Any stairs in or around the home? No  If so, are there any without handrails?  No stairs Home free of loose throw rugs in walkways, pet beds, electrical cords, etc? Yes  Adequate lighting in your home to reduce risk of falls? Yes   ASSISTIVE DEVICES UTILIZED TO PREVENT FALLS:  Life alert? No  Use of a cane, walker or w/c? No  Grab bars in the bathroom? No  Shower chair or bench in shower? Yes  Elevated toilet seat or a handicapped toilet? No   TIMED UP AND GO:  Was the test performed?  No, audio visit .    Cognitive Function:        03/21/2022    2:31 PM  6CIT Screen  What Year? 0 points  What month? 0 points  What time? 0 points  Count back from 20 0  points  Months in reverse 0 points  Repeat phrase 4 points  Total Score 4 points    Immunizations Immunization History  Administered Date(s) Administered   Fluad Quad(high Dose 65+) 02/06/2021   Influenza Split 02/03/2017, 02/09/2018, 02/06/2021   Influenza, High Dose Seasonal PF 02/04/2016, 01/28/2019   PFIZER(Purple Top)SARS-COV-2 Vaccination 06/02/2019, 06/28/2019, 01/30/2020, 11/15/2020   Pfizer Covid-19 Vaccine Bivalent Booster 78yrs & up 02/06/2021, 09/09/2021   Pneumococcal Conjugate-13 09/13/2019   Pneumococcal Polysaccharide-23 04/08/2021   Tdap 06/06/2015   Zoster Recombinat (Shingrix) 07/31/2020, 12/12/2020    TDAP status: Up to date  Flu Vaccine status: Due, Education has been provided regarding the importance of this vaccine. Advised may receive this vaccine at local pharmacy or Health Dept. Aware to provide a copy of the vaccination record if obtained from local pharmacy or Health Dept. Verbalized acceptance and understanding.  Pneumococcal vaccine status: Up to date  Covid-19 vaccine status: Information provided on how to obtain vaccines.   Qualifies for Shingles Vaccine? Yes   Zostavax completed No   Shingrix Completed?: Yes  Screening Tests Health Maintenance  Topic Date Due   INFLUENZA VACCINE  12/03/2021   Medicare Annual Wellness (AWV)  03/19/2022   COLONOSCOPY (Pts 45-80yrs Insurance coverage will need to be confirmed)  10/13/2022   MAMMOGRAM  10/09/2023   Pneumonia Vaccine 48+ Years old  Completed   DEXA SCAN  Completed   COVID-19 Vaccine  Completed   Hepatitis C Screening  Completed   Zoster Vaccines- Shingrix  Completed   HPV VACCINES  Aged Out    Health Maintenance  Health Maintenance Due  Topic Date Due   INFLUENZA VACCINE  12/03/2021   Medicare Annual Wellness (AWV)  03/19/2022    Colorectal cancer screening: Type of screening: Colonoscopy. Completed 10/12/12. Repeat every 10 years  Mammogram status: Completed 10/08/21. Repeat every  year  Bone Density status: Completed 12/19/19. Results reflect: Bone density results: OSTEOPENIA. Repeat every 2 years.  Lung Cancer  Screening: (Low Dose CT Chest recommended if Age 46-80 years, 30 pack-year currently smoking OR have quit w/in 15years.) does not qualify.    Additional Screening:  Hepatitis C Screening: does qualify; Completed 11/14/19  Vision Screening: Recommended annual ophthalmology exams for early detection of glaucoma and other disorders of the eye. Is the patient up to date with their annual eye exam?  Yes  Who is the provider or what is the name of the office in which the patient attends annual eye exams? Dr. Severiano Gilbert If pt is not established with a provider, would they like to be referred to a provider to establish care? No .   Dental Screening: Recommended annual dental exams for proper oral hygiene  Community Resource Referral / Chronic Care Management: CRR required this visit?  No   CCM required this visit?  No      Plan:     I have personally reviewed and noted the following in the patient's chart:   Medical and social history Use of alcohol, tobacco or illicit drugs  Current medications and supplements including opioid prescriptions. Patient is not currently taking opioid prescriptions. Functional ability and status Nutritional status Physical activity Advanced directives List of other physicians Hospitalizations, surgeries, and ER visits in previous 12 months Vitals Screenings to include cognitive, depression, and falls Referrals and appointments  In addition, I have reviewed and discussed with patient certain preventive protocols, quality metrics, and best practice recommendations. A written personalized care plan for preventive services as well as general preventive health recommendations were provided to patient.   Due to this being a telephonic visit, the after visit summary with patients personalized plan was offered to patient via mail  or my-chart. Patient would like to access on my-chart.  Donne Anon, CMA   03/21/2022   Nurse Notes: None    I have reviewed and agree with [facility] documentation.  Willow Ora, MD

## 2022-04-21 DIAGNOSIS — E755 Other lipid storage disorders: Secondary | ICD-10-CM | POA: Diagnosis not present

## 2022-06-10 ENCOUNTER — Ambulatory Visit (INDEPENDENT_AMBULATORY_CARE_PROVIDER_SITE_OTHER): Payer: Medicare HMO | Admitting: Family

## 2022-06-10 ENCOUNTER — Encounter: Payer: Self-pay | Admitting: Family

## 2022-06-10 VITALS — BP 112/64 | HR 70 | Resp 18 | Ht 66.0 in | Wt 140.4 lb

## 2022-06-10 DIAGNOSIS — T25022A Burn of unspecified degree of left foot, initial encounter: Secondary | ICD-10-CM

## 2022-06-10 DIAGNOSIS — Z23 Encounter for immunization: Secondary | ICD-10-CM

## 2022-06-10 DIAGNOSIS — H6991 Unspecified Eustachian tube disorder, right ear: Secondary | ICD-10-CM | POA: Diagnosis not present

## 2022-06-10 MED ORDER — FLUTICASONE PROPIONATE 50 MCG/ACT NA SUSP: 2.0000 | Freq: Every day | NASAL | 6 refills | Status: AC

## 2022-06-10 MED ORDER — CEFDINIR 300 MG PO CAPS: 300.0000 mg | ORAL_CAPSULE | Freq: Two times a day (BID) | ORAL | 0 refills | Status: DC

## 2022-06-10 MED ORDER — SILVER SULFADIAZINE 1 % EX CREA: 1.0000 | TOPICAL_CREAM | Freq: Two times a day (BID) | CUTANEOUS | 0 refills | Status: DC

## 2022-06-10 NOTE — Progress Notes (Signed)
Michelle Rosales is a 67 y.o. female with the following history as recorded in EpicCare:  Patient Active Problem List   Diagnosis Date Noted   Pre-diabetes 10/24/2020    Current Outpatient Medications  Medication Sig Dispense Refill   cefdinir (OMNICEF) 300 MG capsule Take 1 capsule (300 mg total) by mouth 2 (two) times daily. 20 capsule 0   cetirizine (ZYRTEC) 10 MG tablet Take 10 mg by mouth daily.     diclofenac (VOLTAREN) 75 MG EC tablet Take 1 tablet (75 mg total) by mouth 2 (two) times daily as needed. 180 tablet 1   fluticasone (FLONASE) 50 MCG/ACT nasal spray Place 2 sprays into both nostrils daily. 16 g 6   silver sulfADIAZINE (SILVADENE) 1 % cream Apply 1 Application topically 2 (two) times daily. 50 g 0   No current facility-administered medications for this visit.    Allergies: Venlafaxine  Past Medical History:  Diagnosis Date   GERD (gastroesophageal reflux disease)     Past Surgical History:  Procedure Laterality Date   ANAL FISSURE REPAIR     BREAST SURGERY  Maybe 6 years ago.   non cancerous lump removed from left breast   COLONOSCOPY WITH PROPOFOL N/A 10/12/2012   Procedure: COLONOSCOPY WITH PROPOFOL;  Surgeon: Garlan Fair, MD;  Location: [facility];  Service: Endoscopy;  Laterality: N/A;    Family History  Problem Relation Age of Onset   Arthritis Mother    Asthma Brother    Cancer Sister    Cancer Sister     Social History   Tobacco Use   Smoking status: Never   Smokeless tobacco: Never  Substance Use Topics   Alcohol use: Yes    Comment: occassionally    Subjective:   Burn on left foot x 1 week- not healing; spilled hot milk on her foot while making coffee last week;  Also concerned about right ear pain/ "sinus congestion"; does take Zyrtec daily;   Objective:  Vitals:   06/10/22 1443  BP: 112/64  Pulse: 70  Resp: 18  SpO2: 98%  Weight: 140 lb 6.4 oz (63.7 kg)  Height: 5\' 6"  (1.676 m)    General: Well developed, well nourished,  in no acute distress  Skin : Warm and dry. Circular area of erythema noted on outer left foot with pustular scabbed lesion; no warmth or streaking noted;  Head: Normocephalic and atraumatic  Eyes: Sclera and conjunctiva clear; pupils round and reactive to light; extraocular movements intact  Ears: External normal; canals clear; tympanic membranes congested; Oropharynx: Pink, supple. No suspicious lesions  Neck: Supple without thyromegaly, adenopathy  Lungs: Respirations unlabored; clear to auscultation bilaterally without wheeze, rales, rhonchi  CVS exam: normal rate and regular rhythm.  Abdomen: Soft; nontender; nondistended; normoactive bowel sounds; no masses or hepatosplenomegaly  Musculoskeletal: No deformities; no active joint inflammation  Extremities: No edema, cyanosis, clubbing  Vessels: Symmetric bilaterally  Neurologic: Alert and oriented; speech intact; face symmetrical; moves all extremities well; CNII-XII intact without focal deficit   Assessment:  1. Burn of left foot, unspecified burn degree, initial encounter   2. Need for Tdap vaccination   3. Dysfunction of right eustachian tube     Plan:  Rx for Silvadene cream- apply bid to affected area; Rx for Omnicef 300 mg bid x 10 days- will use to cover skin and sinus concerns; Rx for Flonase; follow up worse, no better;   No follow-ups on file.  Orders Placed This Encounter  Procedures  Tdap vaccine greater than or equal to 7yo IM    Requested Prescriptions   Signed Prescriptions Disp Refills   silver sulfADIAZINE (SILVADENE) 1 % cream 50 g 0    Sig: Apply 1 Application topically 2 (two) times daily.   cefdinir (OMNICEF) 300 MG capsule 20 capsule 0    Sig: Take 1 capsule (300 mg total) by mouth 2 (two) times daily.   fluticasone (FLONASE) 50 MCG/ACT nasal spray 16 g 6    Sig: Place 2 sprays into both nostrils daily.

## 2022-06-13 ENCOUNTER — Ambulatory Visit (INDEPENDENT_AMBULATORY_CARE_PROVIDER_SITE_OTHER): Payer: Medicare HMO | Admitting: Family

## 2022-06-13 ENCOUNTER — Encounter: Payer: Self-pay | Admitting: Family

## 2022-06-13 VITALS — BP 132/72 | HR 79 | Resp 18 | Ht 66.0 in | Wt 140.6 lb

## 2022-06-13 DIAGNOSIS — T25022A Burn of unspecified degree of left foot, initial encounter: Secondary | ICD-10-CM

## 2022-06-13 MED ORDER — DOXYCYCLINE HYCLATE 100 MG PO TABS: 100.0000 mg | ORAL_TABLET | Freq: Two times a day (BID) | ORAL | 0 refills | Status: DC

## 2022-06-13 NOTE — Progress Notes (Signed)
Michelle Rosales is a 67 y.o. female with the following history as recorded in [employer]:  Patient Active Problem List   Diagnosis Date Noted   Pre-diabetes 10/24/2020    Current Outpatient Medications  Medication Sig Dispense Refill   cefdinir (OMNICEF) 300 MG capsule Take 1 capsule (300 mg total) by mouth 2 (two) times daily. 20 capsule 0   cetirizine (ZYRTEC) 10 MG tablet Take 10 mg by mouth daily.     diclofenac (VOLTAREN) 75 MG EC tablet Take 1 tablet (75 mg total) by mouth 2 (two) times daily as needed. 180 tablet 1   doxycycline (VIBRA-TABS) 100 MG tablet Take 1 tablet (100 mg total) by mouth 2 (two) times daily. 14 tablet 0   fluticasone (FLONASE) 50 MCG/ACT nasal spray Place 2 sprays into both nostrils daily. 16 g 6   silver sulfADIAZINE (SILVADENE) 1 % cream Apply 1 Application topically 2 (two) times daily. 50 g 0   No current facility-administered medications for this visit.    Allergies: Venlafaxine  Past Medical History:  Diagnosis Date   GERD (gastroesophageal reflux disease)     Past Surgical History:  Procedure Laterality Date   ANAL FISSURE REPAIR     BREAST SURGERY  Maybe 6 years ago.   non cancerous lump removed from left breast   COLONOSCOPY WITH PROPOFOL N/A 10/12/2012   Procedure: COLONOSCOPY WITH PROPOFOL;  Surgeon: Garlan Fair, MD;  Location: [facility];  Service: Endoscopy;  Laterality: N/A;    Family History  Problem Relation Age of Onset   Arthritis Mother    Asthma Brother    Cancer Sister    Cancer Sister     Social History   Tobacco Use   Smoking status: Never   Smokeless tobacco: Never  Substance Use Topics   Alcohol use: Yes    Comment: occassionally    Subjective:   3 day follow up on burn on top of left foot; has been taking Omnicef and using Silvadene twice a day; has noticed improvement in redness/ sensation of the top of left foot;  Is concerned that right ear has not improved on current oral antibiotics;   Objective:   Vitals:   06/13/22 1336  BP: 132/72  Pulse: 79  Resp: 18  SpO2: 99%  Weight: 140 lb 9.6 oz (63.8 kg)  Height: 5' 6"$  (1.676 m)    General: Well developed, well nourished, in no acute distress  Skin : Warm and dry. Burn noted on top of left foot- redness improved compared to 3 days ago; no warmth or streaking noted;  Head: Normocephalic and atraumatic  Eyes: Sclera and conjunctiva clear; pupils round and reactive to light; extraocular movements intact  Ears: External normal; canals clear; tympanic membranes congested;  Oropharynx: Pink, supple. No suspicious lesions  Neck: Supple without thyromegaly, adenopathy  Lungs: Respirations unlabored;  Neurologic: Alert and oriented; speech intact; face symmetrical; moves all extremities well; CNII-XII intact without focal deficit   Assessment:  1. Burn of left foot, unspecified burn degree, initial encounter     Plan:  Improving; d/c Omnicef- change to Doxycycline; increase Silvadene application to tid over the weekend and then back to bid; follow up worse, no better; Explained that am hopeful the Doxycycline will work better for her ear/ sinus needs while still being able to cover for skin infection with burn.   No follow-ups on file.  No orders of the defined types were placed in this encounter.   Requested Prescriptions  Signed Prescriptions Disp Refills   doxycycline (VIBRA-TABS) 100 MG tablet 14 tablet 0    Sig: Take 1 tablet (100 mg total) by mouth 2 (two) times daily.

## 2022-09-01 NOTE — Progress Notes (Deleted)
[facility] at [facility] [address] [place], [place] [address] [phone]  Date:  09/04/2022   Name:  Michelle Rosales   DOB:  December 05, 1955   MRN:  [id number]  PCP:  Michelle Cables, MD    Chief Complaint: No chief complaint on file.   History of Present Illness:  Michelle Rosales is a 67 y.o. very pleasant female patient who presents with the following:  Patient seen today for physical.  History of prediabetes, GERD Most recent visit with myself about 1 year ago; at that time she was dealing with some chest pain as well as hip problems I sent her for a Myoview completed 09/10/2021, negative  Colonoscopy is due this summer Mammogram due in June Shingrix is complete Lab work completed about 1 year ago, update today  Never smoker, occasional alcohol Married to Michelle Rosales  Patient Active Problem List   Diagnosis Date Noted   Pre-diabetes 10/24/2020    Past Medical History:  Diagnosis Date   GERD (gastroesophageal reflux disease)     Past Surgical History:  Procedure Laterality Date   ANAL FISSURE REPAIR     BREAST SURGERY  Maybe 6 years ago.   non cancerous lump removed from left breast   COLONOSCOPY WITH PROPOFOL N/A 10/12/2012   Procedure: COLONOSCOPY WITH PROPOFOL;  Surgeon: Charolett Bumpers, MD;  Location: [facility];  Service: Endoscopy;  Laterality: N/A;    Social History   Tobacco Use   Smoking status: Never   Smokeless tobacco: Never  Substance Use Topics   Alcohol use: Yes    Comment: occassionally   Drug use: No    Family History  Problem Relation Age of Onset   Arthritis Mother    Asthma Brother    Cancer Sister    Cancer Sister     Allergies  Allergen Reactions   Venlafaxine Itching    Medication list has been reviewed and updated.  Current Outpatient Medications on File Prior to Visit  Medication Sig Dispense Refill   cefdinir (OMNICEF) 300 MG capsule Take 1 capsule (300 mg total) by  mouth 2 (two) times daily. 20 capsule 0   cetirizine (ZYRTEC) 10 MG tablet Take 10 mg by mouth daily.     diclofenac (VOLTAREN) 75 MG EC tablet Take 1 tablet (75 mg total) by mouth 2 (two) times daily as needed. 180 tablet 1   doxycycline (VIBRA-TABS) 100 MG tablet Take 1 tablet (100 mg total) by mouth 2 (two) times daily. 14 tablet 0   fluticasone (FLONASE) 50 MCG/ACT nasal spray Place 2 sprays into both nostrils daily. 16 g 6   silver sulfADIAZINE (SILVADENE) 1 % cream Apply 1 Application topically 2 (two) times daily. 50 g 0   No current facility-administered medications on file prior to visit.    Review of Systems:  As per HPI- otherwise negative.   Physical Examination: There were no vitals filed for this visit. There were no vitals filed for this visit. There is no height or weight on file to calculate BMI. Ideal Body Weight:    GEN: no acute distress. HEENT: Atraumatic, Normocephalic.  Ears and Nose: No external deformity. CV: RRR, No M/G/R. No JVD. No thrill. No extra heart sounds. PULM: CTA B, no wheezes, crackles, rhonchi. No retractions. No resp. distress. No accessory muscle use. ABD: S, NT, ND, +BS. No rebound. No HSM. EXTR: No c/c/e PSYCH: Normally interactive. Conversant.    Assessment and  Plan: *** Physical exam today.  Encouraged healthy diet and exercise routine Will plan further follow- up pending labs.  Signed Abbe Amsterdam, MD

## 2022-09-04 ENCOUNTER — Encounter: Payer: Medicare HMO | Admitting: Family Medicine

## 2022-09-14 NOTE — Progress Notes (Unsigned)
[facility] at [facility] [address], [address] [place], [place] [address] [phone]  Date:  09/17/2022   Name:  Michelle Rosales   DOB:  Sep 07, 1955   MRN:  [id number]  PCP:  Pearline Cables, MD    Chief Complaint: No chief complaint on file.   History of Present Illness:  Michelle Rosales is a 67 y.o. very pleasant female patient who presents with the following:  Patient seen today for physical Most recent visit with myself was about 1 year ago at which time she had concern of chest pain Otherwise, history of prediabetes and GERD I sent her for a Myoview last year, normal results  Recommend COVID booster as needed Colon cancer screening is due this year Mammogram is up-to-date Bone density can be ordered Update blood work Patient Active Problem List   Diagnosis Date Noted   Pre-diabetes 10/24/2020    Past Medical History:  Diagnosis Date   GERD (gastroesophageal reflux disease)     Past Surgical History:  Procedure Laterality Date   ANAL FISSURE REPAIR     BREAST SURGERY  Maybe 6 years ago.   non cancerous lump removed from left breast   COLONOSCOPY WITH PROPOFOL N/A 10/12/2012   Procedure: COLONOSCOPY WITH PROPOFOL;  Surgeon: Charolett Bumpers, MD;  Location: [facility];  Service: Endoscopy;  Laterality: N/A;    Social History   Tobacco Use   Smoking status: Never   Smokeless tobacco: Never  Substance Use Topics   Alcohol use: Yes    Comment: occassionally   Drug use: No    Family History  Problem Relation Age of Onset   Arthritis Mother    Asthma Brother    Cancer Sister    Cancer Sister     Allergies  Allergen Reactions   Venlafaxine Itching    Medication list has been reviewed and updated.  Current Outpatient Medications on File Prior to Visit  Medication Sig Dispense Refill   cetirizine (ZYRTEC) 10 MG tablet Take 10 mg by mouth daily.     diclofenac (VOLTAREN) 75 MG EC tablet Take 1 tablet  (75 mg total) by mouth 2 (two) times daily as needed. 180 tablet 1   fluticasone (FLONASE) 50 MCG/ACT nasal spray Place 2 sprays into both nostrils daily. 16 g 6   silver sulfADIAZINE (SILVADENE) 1 % cream Apply 1 Application topically 2 (two) times daily. 50 g 0   No current facility-administered medications on file prior to visit.    Review of Systems:  As per HPI- otherwise negative.   Physical Examination: There were no vitals filed for this visit. There were no vitals filed for this visit. There is no height or weight on file to calculate BMI. Ideal Body Weight:    GEN: no acute distress. HEENT: Atraumatic, Normocephalic.  Ears and Nose: No external deformity. CV: RRR, No M/G/R. No JVD. No thrill. No extra heart sounds. PULM: CTA B, no wheezes, crackles, rhonchi. No retractions. No resp. distress. No accessory muscle use. ABD: S, NT, ND, +BS. No rebound. No HSM. EXTR: No c/c/e PSYCH: Normally interactive. Conversant.    Assessment and Plan: *** Physical exam today.  Encouraged healthy diet and exercise routine Will plan further follow- up pending labs.  Signed Abbe Amsterdam, MD

## 2022-09-14 NOTE — Patient Instructions (Signed)
It was great to see you again today, I will be in touch with your labs soon as possible Recommend COVID booster if not done in the last 9 months or so- ok to do this fall if you prefer however It looks like your colon cancer screening is due this year- ordered Cologuard kit which will come to your home  Stop by imaging on the ground floor to set up bone density scan  Please see me in 6- 12 months assuming all is well and keep up the great work!

## 2022-09-17 ENCOUNTER — Encounter: Payer: Self-pay | Admitting: Family Medicine

## 2022-09-17 ENCOUNTER — Ambulatory Visit (INDEPENDENT_AMBULATORY_CARE_PROVIDER_SITE_OTHER): Payer: Medicare HMO | Admitting: Family Medicine

## 2022-09-17 VITALS — BP 110/62 | HR 64 | Temp 97.5°F | Resp 18 | Ht 66.0 in | Wt 132.4 lb

## 2022-09-17 DIAGNOSIS — Z0000 Encounter for general adult medical examination without abnormal findings: Secondary | ICD-10-CM | POA: Diagnosis not present

## 2022-09-17 DIAGNOSIS — E2839 Other primary ovarian failure: Secondary | ICD-10-CM

## 2022-09-17 DIAGNOSIS — Z13220 Encounter for screening for lipoid disorders: Secondary | ICD-10-CM | POA: Diagnosis not present

## 2022-09-17 DIAGNOSIS — Z1329 Encounter for screening for other suspected endocrine disorder: Secondary | ICD-10-CM

## 2022-09-17 DIAGNOSIS — Z130 Encounter for screening for diseases of the blood and blood-forming organs and certain disorders involving the immune mechanism: Secondary | ICD-10-CM | POA: Diagnosis not present

## 2022-09-17 DIAGNOSIS — Z1211 Encounter for screening for malignant neoplasm of colon: Secondary | ICD-10-CM

## 2022-09-17 DIAGNOSIS — R7303 Prediabetes: Secondary | ICD-10-CM

## 2022-09-17 DIAGNOSIS — R5383 Other fatigue: Secondary | ICD-10-CM | POA: Diagnosis not present

## 2022-09-17 DIAGNOSIS — E785 Hyperlipidemia, unspecified: Secondary | ICD-10-CM

## 2022-09-17 LAB — CBC
HCT: 38.6 % (ref 36.0–46.0)
Hemoglobin: 13.1 g/dL (ref 12.0–15.0)
MCHC: 34 g/dL (ref 30.0–36.0)
MCV: 94.5 fl (ref 78.0–100.0)
Platelets: 186 10*3/uL (ref 150.0–400.0)
RBC: 4.08 Mil/uL (ref 3.87–5.11)
RDW: 13 % (ref 11.5–15.5)
WBC: 5.7 10*3/uL (ref 4.0–10.5)

## 2022-09-17 LAB — LIPID PANEL
Cholesterol: 224 mg/dL — ABNORMAL HIGH (ref 0–200)
HDL: 58.3 mg/dL (ref >39.00)
LDL Cholesterol: 135 mg/dL — ABNORMAL HIGH (ref 0–99)
NonHDL: 165.3
Total CHOL/HDL Ratio: 4
Triglycerides: 150 mg/dL — ABNORMAL HIGH (ref 0.0–149.0)
VLDL: 30 mg/dL (ref 0.0–40.0)

## 2022-09-17 LAB — COMPREHENSIVE METABOLIC PANEL
ALT: 13 U/L (ref 0–35)
AST: 19 U/L (ref 0–37)
Albumin: 4.4 g/dL (ref 3.5–5.2)
Alkaline Phosphatase: 68 U/L (ref 39–117)
BUN: 16 mg/dL (ref 6–23)
CO2: 27 mEq/L (ref 19–32)
Calcium: 9.5 mg/dL (ref 8.4–10.5)
Chloride: 103 mEq/L (ref 96–112)
Creatinine, Ser: 0.73 mg/dL (ref 0.40–1.20)
GFR: 84.21 mL/min (ref >60.00)
Glucose, Bld: 92 mg/dL (ref 70–99)
Potassium: 4.2 mEq/L (ref 3.5–5.1)
Sodium: 137 mEq/L (ref 135–145)
Total Bilirubin: 0.6 mg/dL (ref 0.2–1.2)
Total Protein: 7 g/dL (ref 6.0–8.3)

## 2022-09-17 LAB — HEMOGLOBIN A1C: Hgb A1c MFr Bld: 5.8 % (ref 4.6–6.5)

## 2022-09-17 LAB — TSH: TSH: 1.21 u[IU]/mL (ref 0.35–5.50)

## 2022-09-17 LAB — VITAMIN D 25 HYDROXY (VIT D DEFICIENCY, FRACTURES): VITD: 33.18 ng/mL (ref 30.00–100.00)

## 2022-09-18 MED ORDER — PREDNISONE 20 MG PO TABS: ORAL_TABLET | ORAL | 0 refills | Status: DC

## 2022-09-18 MED ORDER — PRAVASTATIN SODIUM 20 MG PO TABS
20.0000 mg | ORAL_TABLET | Freq: Every day | ORAL | 2 refills | Status: DC
Start: 2022-09-18 — End: 2023-11-09

## 2022-09-30 DIAGNOSIS — Z1211 Encounter for screening for malignant neoplasm of colon: Secondary | ICD-10-CM | POA: Diagnosis not present

## 2022-10-02 ENCOUNTER — Ambulatory Visit (HOSPITAL_BASED_OUTPATIENT_CLINIC_OR_DEPARTMENT_OTHER)
Admission: RE | Admit: 2022-10-02 | Discharge: 2022-10-02 | Disposition: A | Discharge status: Home / Self Care | Payer: Medicare HMO | Source: Ambulatory Visit | Attending: Family Medicine | Admitting: Family Medicine

## 2022-10-02 ENCOUNTER — Encounter: Payer: Self-pay | Admitting: Family Medicine

## 2022-10-02 DIAGNOSIS — M8589 Other specified disorders of bone density and structure, multiple sites: Secondary | ICD-10-CM | POA: Diagnosis not present

## 2022-10-02 DIAGNOSIS — M8580 Other specified disorders of bone density and structure, unspecified site: Secondary | ICD-10-CM | POA: Insufficient documentation

## 2022-10-02 DIAGNOSIS — E2839 Other primary ovarian failure: Secondary | ICD-10-CM | POA: Insufficient documentation

## 2022-10-02 DIAGNOSIS — Z780 Asymptomatic menopausal state: Secondary | ICD-10-CM | POA: Diagnosis not present

## 2022-10-06 ENCOUNTER — Encounter: Payer: Self-pay | Admitting: Family Medicine

## 2022-10-06 LAB — COLOGUARD: COLOGUARD: NEGATIVE

## 2022-10-13 DIAGNOSIS — Z1231 Encounter for screening mammogram for malignant neoplasm of breast: Secondary | ICD-10-CM | POA: Diagnosis not present

## 2022-10-13 LAB — HM MAMMOGRAPHY

## 2022-10-22 DIAGNOSIS — H43393 Other vitreous opacities, bilateral: Secondary | ICD-10-CM | POA: Diagnosis not present

## 2022-10-22 DIAGNOSIS — T1512XA Foreign body in conjunctival sac, left eye, initial encounter: Secondary | ICD-10-CM | POA: Diagnosis not present

## 2022-10-22 DIAGNOSIS — H52203 Unspecified astigmatism, bilateral: Secondary | ICD-10-CM | POA: Diagnosis not present

## 2022-10-22 DIAGNOSIS — H524 Presbyopia: Secondary | ICD-10-CM | POA: Diagnosis not present

## 2022-10-22 DIAGNOSIS — H40003 Preglaucoma, unspecified, bilateral: Secondary | ICD-10-CM | POA: Diagnosis not present

## 2022-10-22 DIAGNOSIS — H47393 Other disorders of optic disc, bilateral: Secondary | ICD-10-CM | POA: Diagnosis not present

## 2022-10-22 DIAGNOSIS — H2513 Age-related nuclear cataract, bilateral: Secondary | ICD-10-CM | POA: Diagnosis not present

## 2022-10-22 DIAGNOSIS — H5203 Hypermetropia, bilateral: Secondary | ICD-10-CM | POA: Diagnosis not present

## 2022-11-04 ENCOUNTER — Encounter (INDEPENDENT_AMBULATORY_CARE_PROVIDER_SITE_OTHER): Payer: Medicare HMO | Admitting: Family Medicine

## 2022-11-04 DIAGNOSIS — U071 COVID-19: Secondary | ICD-10-CM | POA: Diagnosis not present

## 2022-11-04 MED ORDER — NIRMATRELVIR/RITONAVIR (PAXLOVID)TABLET
3.0000 | ORAL_TABLET | Freq: Two times a day (BID) | ORAL | 0 refills | Status: AC
Start: 2022-11-04 — End: 2022-11-09

## 2022-11-04 NOTE — Telephone Encounter (Signed)
Please see the MyChart message reply(ies) for my assessment and plan.  The patient gave consent for this Medical Advice Message and is aware that it may result in a bill to their insurance company as well as the possibility that this may result in a co-payment or deductible. They are an established patient, but are not seeking medical advice exclusively about a problem treated during an in person or video visit in the last 7 days. I did not recommend an in person or video visit within 7 days of my reply.  I spent a total of 10 minutes cumulative time within 7 days through MyChart messaging Rayshawn Visconti, MD  

## 2023-03-04 ENCOUNTER — Ambulatory Visit (INDEPENDENT_AMBULATORY_CARE_PROVIDER_SITE_OTHER): Payer: Medicare HMO | Admitting: Family Medicine

## 2023-03-04 VITALS — BP 112/64 | HR 79 | Resp 18 | Ht 66.0 in | Wt 129.8 lb

## 2023-03-04 DIAGNOSIS — M778 Other enthesopathies, not elsewhere classified: Secondary | ICD-10-CM | POA: Diagnosis not present

## 2023-03-04 IMAGING — DX DG LUMBAR SPINE COMPLETE 4+V
5 series · 5 of 5 positions shown · non-contrast
Comparison: Lumbar radiograph 05/11/2014

CLINICAL DATA: Low back pain radiating down right leg. Chronic
right-sided low back pain with right-sided sciatica.

EXAM:
LUMBAR SPINE - COMPLETE 4+ VIEW

[l-spine ap]
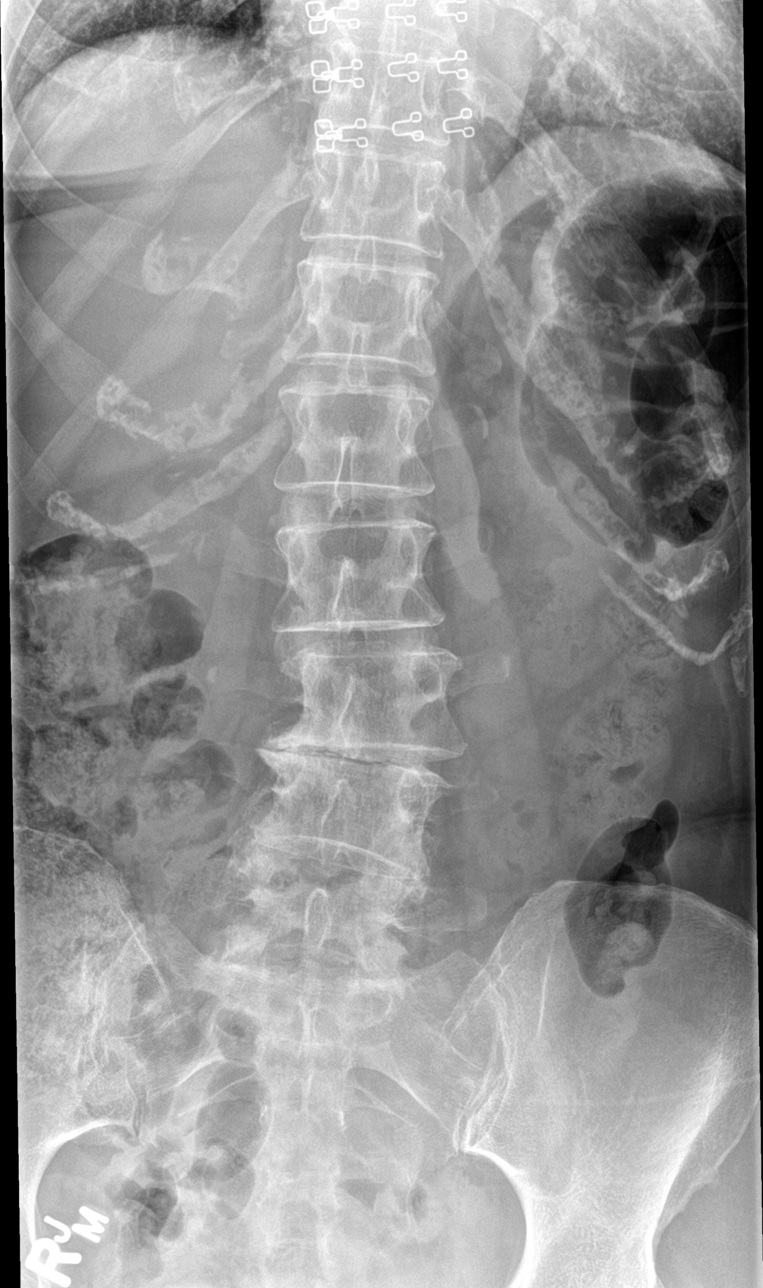

[l-spine obl (1 of 2)]
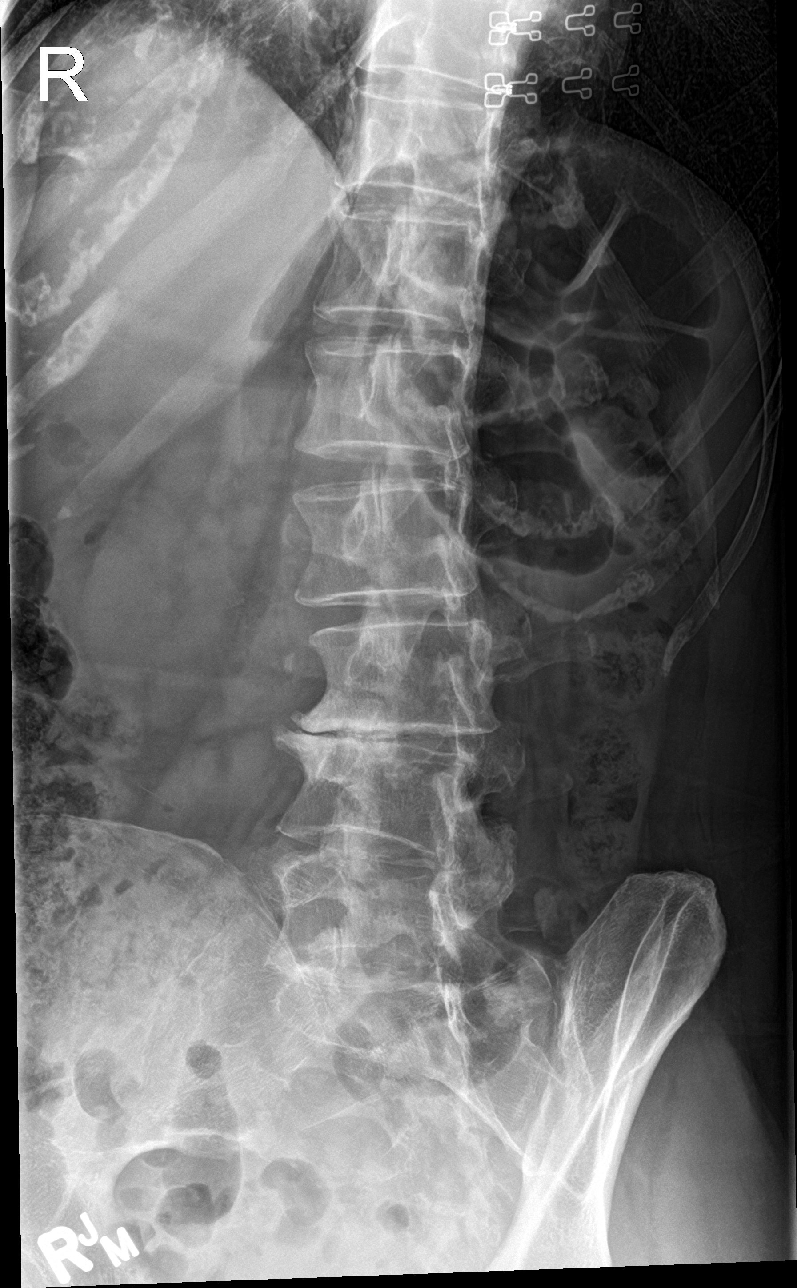

[l-spine obl (2 of 2)]
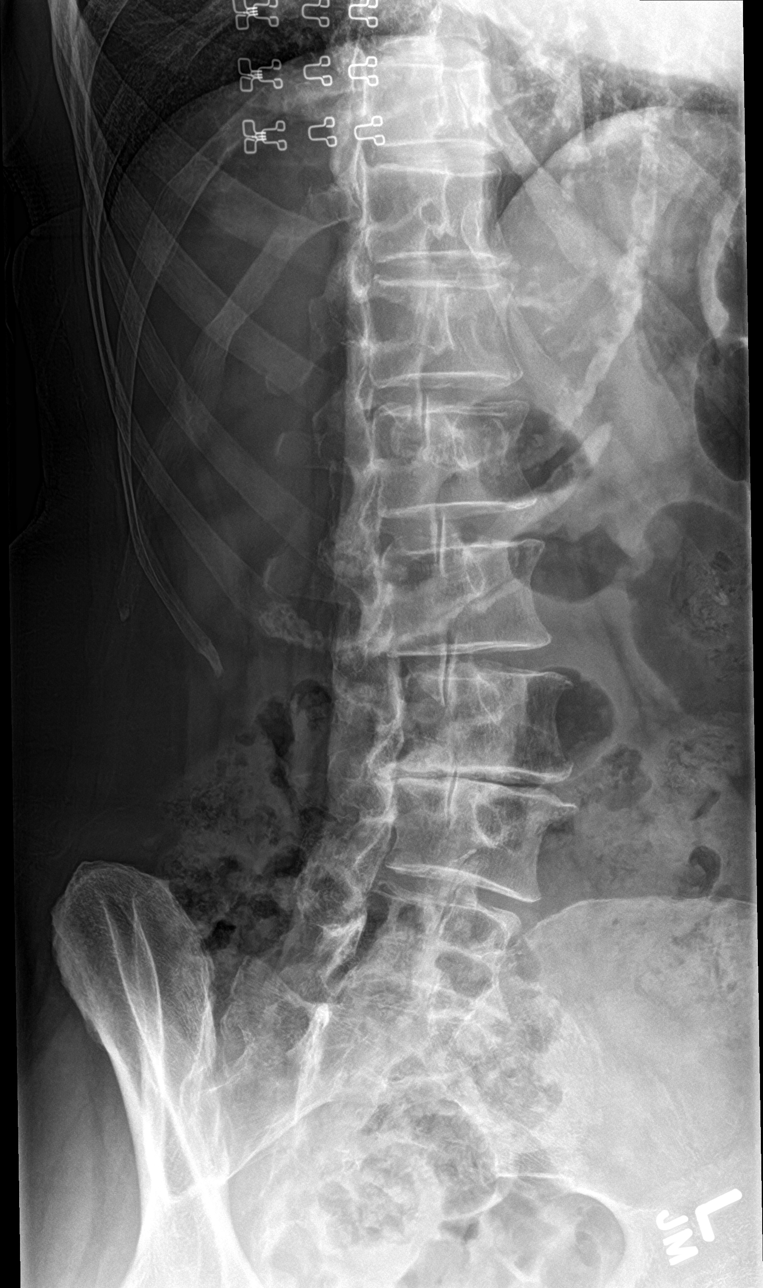

[l-spine lat]
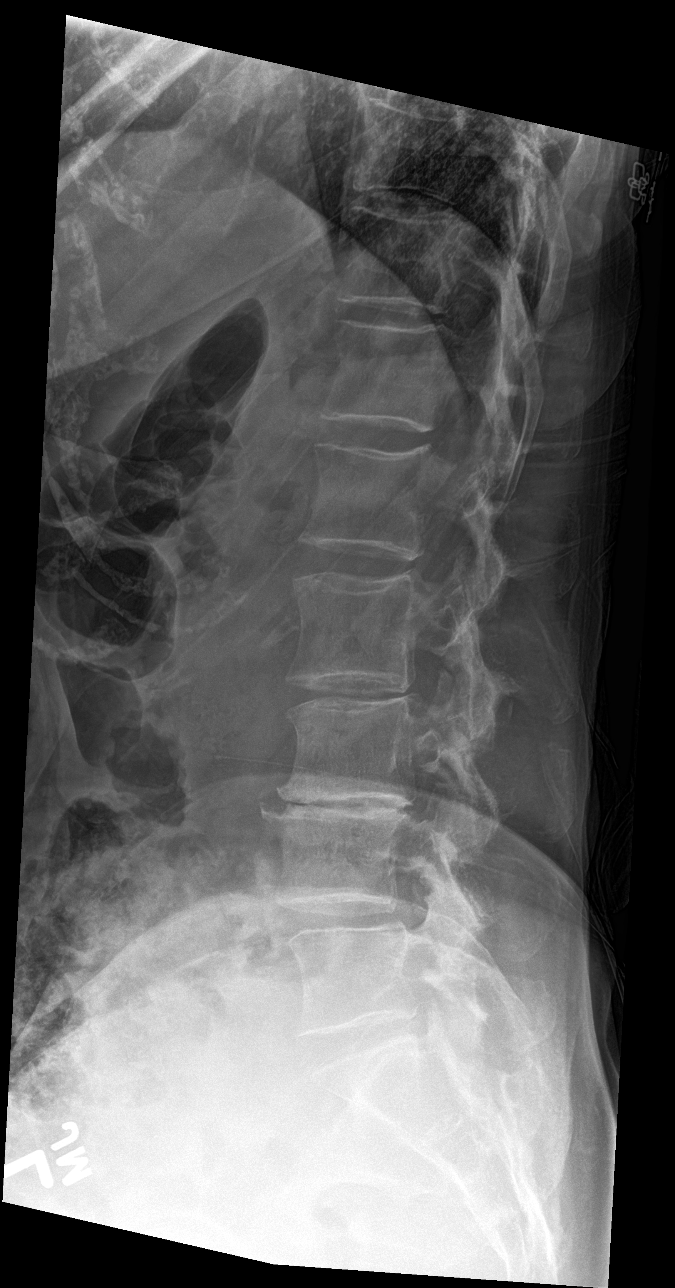

[l-spine spot]
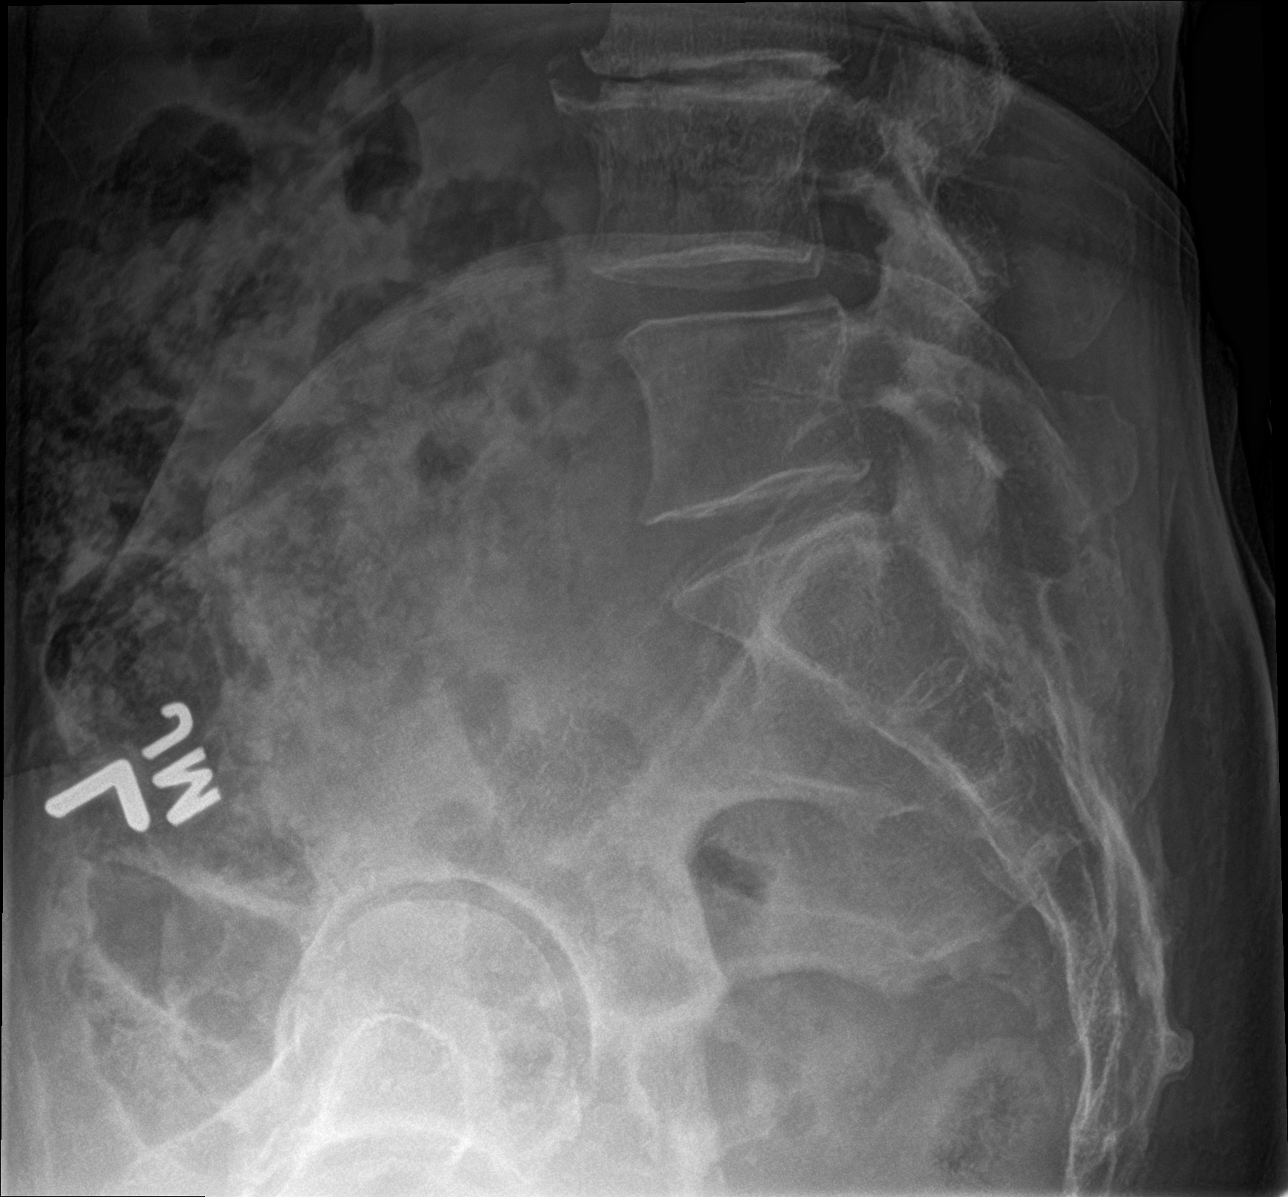

[5 of 5 positions shown; findings below may reference images not displayed]

FINDINGS: There are 5 lumbar type vertebra with diminutive ribs at T12.
Progressive scoliotic curvature centered at L3-L4. Progressive disc
space narrowing and endplate spurring at this level. There is also
disc space narrowing and endplate spurring at L2-L3, new. Grade 1 3
mm anterolisthesis of L4 on L5 which is new from prior exam. Mild
facet hypertrophy at this level. Vertebral body heights are normal.
There is no evidence of fracture, focal bone lesion or bone
destruction. The sacroiliac joints are congruent.
IMPRESSION: 1. Progressive degenerative disc disease and scoliotic curvature at
L3-L4 from 5103.
2. New grade 1 anterolisthesis of L4 on L5, likely facet mediated.

## 2023-03-04 IMAGING — DX DG TIBIA/FIBULA 2V*R*
4 series · 4 of 4 positions shown · non-contrast
Comparison: None.

CLINICAL DATA: Pain in right shin, no known injury.  Chronic pain.

EXAM:
RIGHT TIBIA AND FIBULA - 2 VIEW

[tibia ap (1 of 2)]
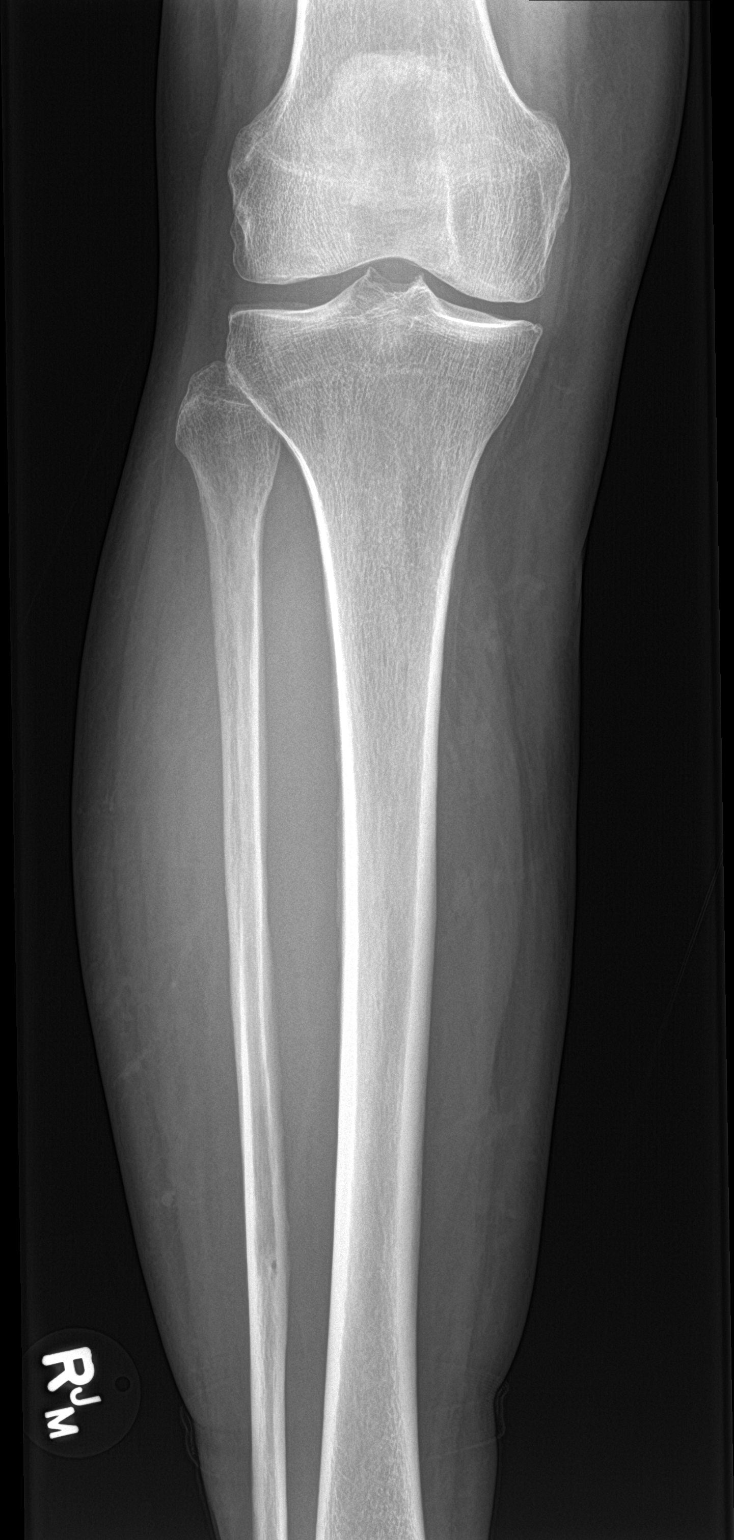

[tibia ap (2 of 2)]
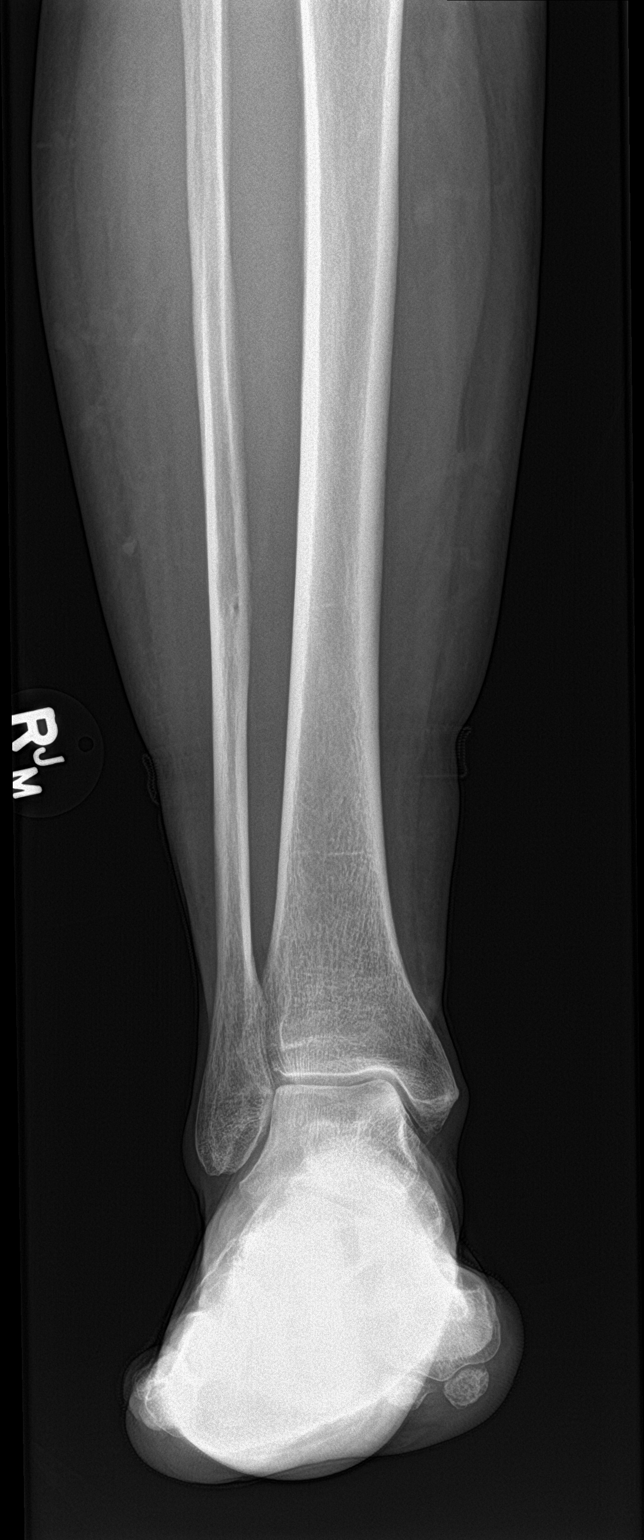

[tibia lat (1 of 2)]
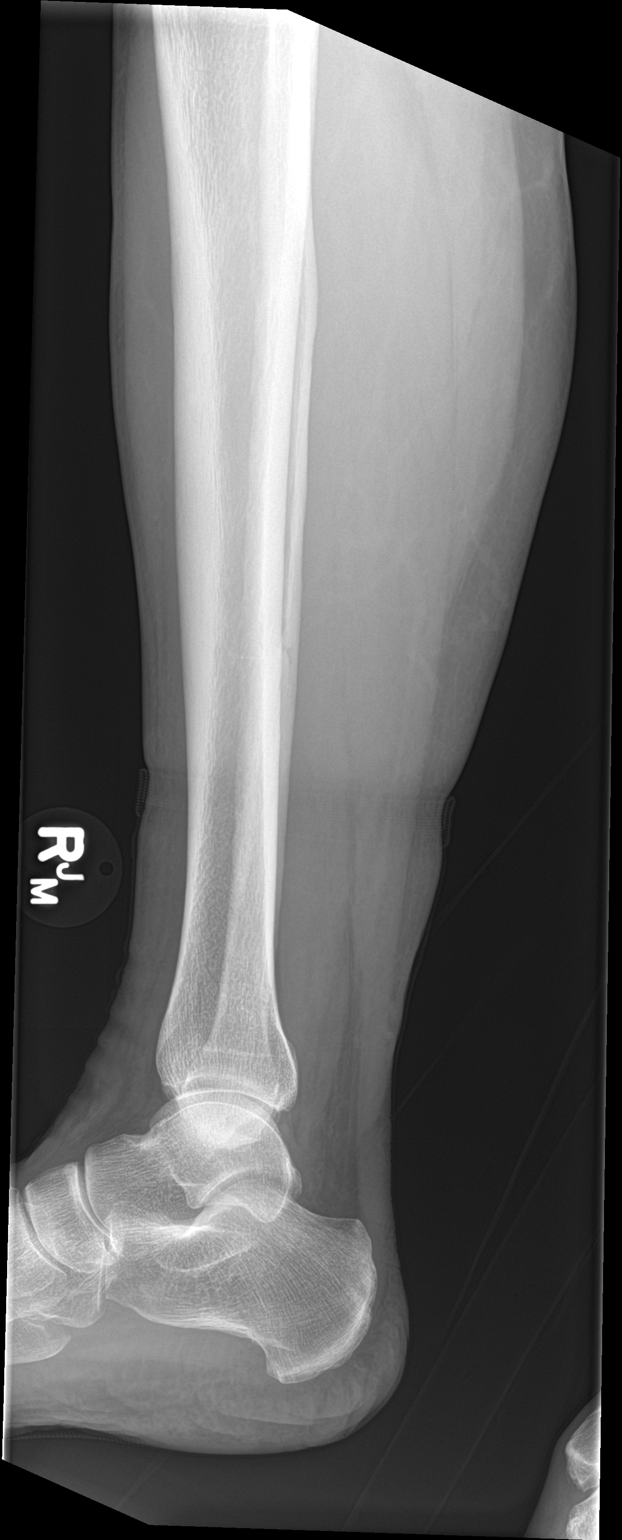

[tibia lat (2 of 2)]
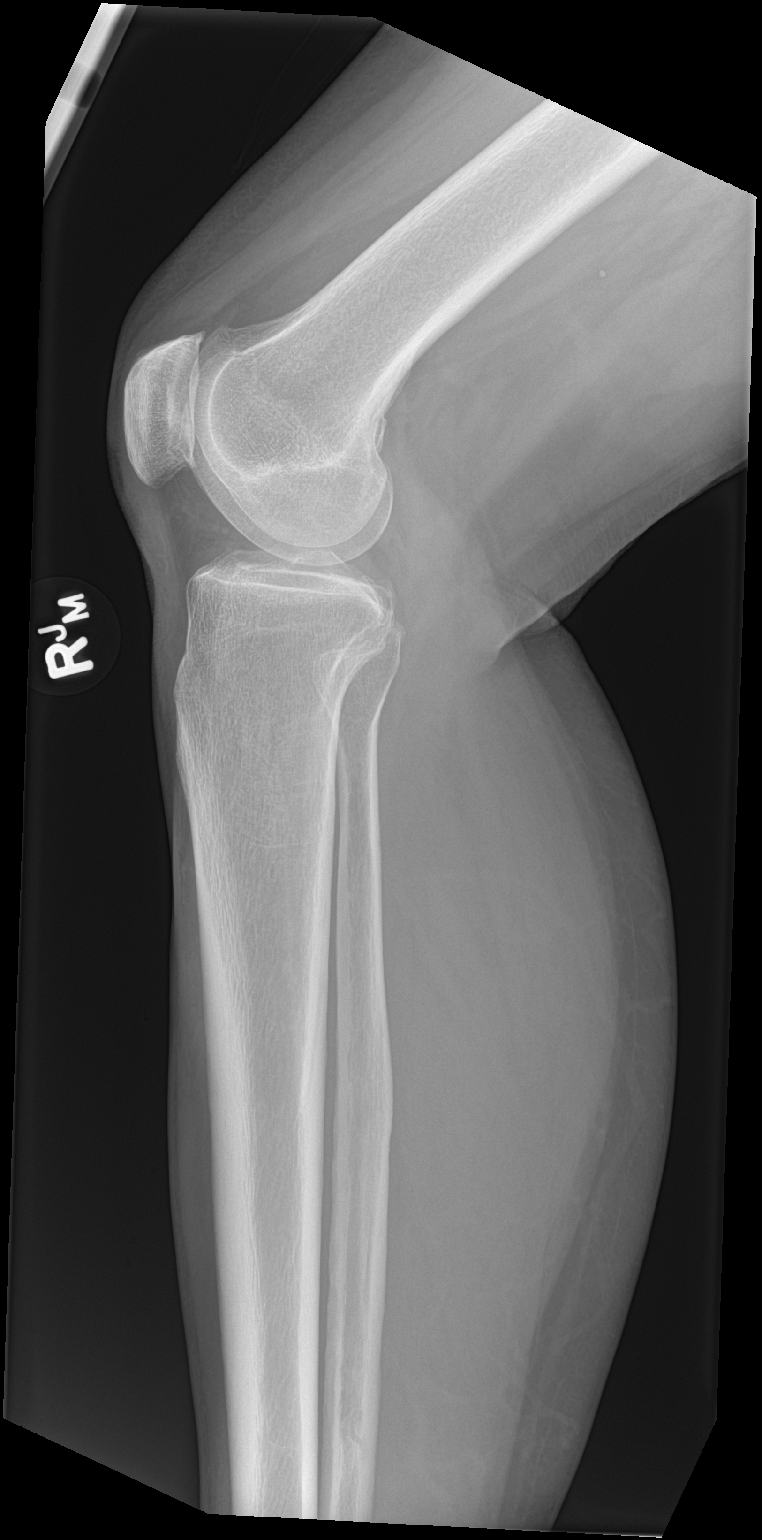

[4 of 4 positions shown; findings below may reference images not displayed]

FINDINGS: Small cortical lucency in the medial aspect of the mid distal
fibular shaft, unclear if this represents a nutrient channel or
small lucent lesion. There is no associated bony destruction or
periosteal reaction. Cortical margins of the tibia are intact. No
visualized fracture. Knee and ankle alignment are maintained. There
is no focal soft tissue abnormality.
IMPRESSION: Small cortical lucency in the medial aspect of the mid distal
fibular shaft, unclear if this represents a nutrient channel or
small lucent lesion. Consider further evaluation with MRI.

## 2023-03-04 NOTE — Progress Notes (Signed)
[facility] at [facility] [address] [place], [place] [address] [phone]  Date:  03/04/2023   Name:  Michelle Rosales   DOB:  11/11/1955   MRN:  [id number]  PCP:  Michelle Cables, MD    Chief Complaint: Foot Injury (L foot, top of foot /Pt states while on vacation she did a lot of walking, after returning back from vacation pts foot hurts when walking )   History of Present Illness:  Michelle Rosales is a 67 y.o. very pleasant female patient who presents with the following:  Pt with history of pre-diabetes, seen today with a foot injury   She was at the beach for a week recently- she was doing a lot of barefoot walking on the sand - she may have overdone it This past Friday (today is Wednesday) she took too long walks no injury that she is aware of but she started to have pain in the left foot while she was walking It has waxed and waned some over the last few days Using ice and voltaren as needed The right foot seems fine -not painful She notes her left foot hurts when she walks on it, it feels fine at rest  She does have history of osteopenia and some hip problems  Covid and flu shots UTD  Lab Results  Component Value Date   HGBA1C 5.8 09/17/2022     Patient Active Problem List   Diagnosis Date Noted   Osteopenia 10/02/2022   Pre-diabetes 10/24/2020    Past Medical History:  Diagnosis Date   GERD (gastroesophageal reflux disease)     Past Surgical History:  Procedure Laterality Date   ANAL FISSURE REPAIR     BREAST SURGERY  Maybe 6 years ago.   non cancerous lump removed from left breast   COLONOSCOPY WITH PROPOFOL N/A 10/12/2012   Procedure: COLONOSCOPY WITH PROPOFOL;  Surgeon: Charolett Bumpers, MD;  Location: [facility];  Service: Endoscopy;  Laterality: N/A;    Social History   Tobacco Use   Smoking status: Never   Smokeless tobacco: Never  Substance Use Topics   Alcohol use: Yes    Comment:  occassionally   Drug use: No    Family History  Problem Relation Age of Onset   Arthritis Mother    Asthma Brother    Cancer Sister    Cancer Sister     Allergies  Allergen Reactions   Venlafaxine Itching    Medication list has been reviewed and updated.  Current Outpatient Medications on File Prior to Visit  Medication Sig Dispense Refill   acetaminophen (TYLENOL) 325 MG tablet Take by mouth.     cetirizine (ZYRTEC) 10 MG tablet Take 10 mg by mouth daily.     diclofenac (VOLTAREN) 75 MG EC tablet Take 1 tablet (75 mg total) by mouth 2 (two) times daily as needed. 180 tablet 1   fluticasone (FLONASE) 50 MCG/ACT nasal spray Place 2 sprays into both nostrils daily. 16 g 6   pravastatin (PRAVACHOL) 20 MG tablet Take 1 tablet (20 mg total) by mouth daily. 90 tablet 2   predniSONE (DELTASONE) 20 MG tablet Take 40 mg by mouth daily for 3 days, then 20 mg by mouth daily for 3 days 9 tablet 0   No current facility-administered medications on file prior to visit.    Review of Systems:  As per HPI- otherwise negative.   Physical Examination: Vitals:  03/04/23 0829  BP: 112/64  Pulse: 79  Resp: 18  SpO2: 98%   Vitals:   03/04/23 0829  Weight: 129 lb 12.8 oz (58.9 kg)  Height: 5\' 6"  (1.676 m)   Body mass index is 20.95 kg/m. Ideal Body Weight: Weight in (lb) to have BMI = 25: 154.6  .GEN: no acute distress.  Slender build, looks well HEENT: Atraumatic, Normocephalic.  Ears and Nose: No external deformity. CV: RRR, No M/G/R. No JVD. No thrill. No extra heart sounds. PULM: CTA B, no wheezes, crackles, rhonchi. No retractions. No resp. distress. No accessory muscle use. EXTR: No c/c/e PSYCH: Normally interactive. Conversant.  LEFT foot: There is minimal swelling over the dorsum of the foot.  No bruising.  There are some tenderness with palpation over the 2nd through 5th metatarsals, suspect tendinitis Foot is warm and well-perfused Right foot is normal Left ankle  normal, Achilles intact  Assessment and Plan: Extensor tendonitis of foot  Patient seen today with likely overuse injury of her left foot due to walking barefoot on sand.  We discussed doing x-rays today, she would like to defer-can do at a later date if her foot fails to get better.  In the meantime I recommended she use relative rest, no walking for exercise.  Wear supportive shoes.  She might try a postoperative shoe over-the-counter if she would like.  Ice, Voltaren gel, oral NSAID scheduled for about 5 days.  She will let me know if this fails to resolve her symptoms  Signed Abbe Amsterdam, MD

## 2023-03-04 NOTE — Patient Instructions (Addendum)
I suspect you have tendonitis in your foot from walking barefoot on a soft surface (sand) Would suggest ice, voltaren gel, relative rest (no walking for exercise until your foot is better) and wear the shoes that feel best- athletic shoes may be your best option   You might also want to take aleve twice a day scheduled for 5 days or so  You can purchase a "post operative" hard soled velcro shoe online or at the pharmacy- sometimes this may feel good as it keeps the foot from bending when you walk  Please let me know if you are not much better in the next few days

## 2023-03-13 ENCOUNTER — Other Ambulatory Visit: Payer: Self-pay | Admitting: Medical Genetics

## 2023-03-13 DIAGNOSIS — Z006 Encounter for examination for normal comparison and control in clinical research program: Secondary | ICD-10-CM

## 2023-04-13 ENCOUNTER — Other Ambulatory Visit (HOSPITAL_COMMUNITY)
Admission: RE | Admit: 2023-04-13 | Discharge: 2023-04-13 | Disposition: A | Discharge status: Home / Self Care | Payer: Self-pay | Source: Ambulatory Visit | Attending: Oncology | Admitting: Oncology

## 2023-04-13 DIAGNOSIS — Z006 Encounter for examination for normal comparison and control in clinical research program: Secondary | ICD-10-CM | POA: Insufficient documentation

## 2023-04-27 LAB — GENECONNECT MOLECULAR SCREEN: Genetic Analysis Overall Interpretation: NEGATIVE

## 2023-05-12 ENCOUNTER — Encounter: Payer: Self-pay | Admitting: Family Medicine

## 2023-05-12 ENCOUNTER — Ambulatory Visit (INDEPENDENT_AMBULATORY_CARE_PROVIDER_SITE_OTHER): Payer: Medicare HMO | Admitting: Family Medicine

## 2023-05-12 VITALS — BP 130/74 | HR 71 | Temp 98.0°F | Resp 16 | Ht 66.0 in | Wt 132.0 lb

## 2023-05-12 DIAGNOSIS — L03011 Cellulitis of right finger: Secondary | ICD-10-CM

## 2023-05-12 MED ORDER — CEPHALEXIN 500 MG PO CAPS: 500.0000 mg | ORAL_CAPSULE | Freq: Three times a day (TID) | ORAL | 0 refills | Status: AC

## 2023-05-12 NOTE — Progress Notes (Signed)
 Chief Complaint  Patient presents with   Thumb pain    Right thumb injury    Michelle Rosales is a 68 y.o. female here for a skin complaint.  Duration: 2 months Location: R thumb Pruritic? No Painful? Yes Drainage? Yes New soaps/lotions/topicals/detergents? No Started after gardening.  Other associated symptoms: no fevers or streaking redness.  Therapies tried thus far: diligent watching, antiseptic wash  Past Medical History:  Diagnosis Date   GERD (gastroesophageal reflux disease)     BP 130/74   Pulse 71   Temp 98 F (36.7 C) (Oral)   Resp 16   Ht 5' 6 (1.676 m)   Wt 132 lb (59.9 kg)   SpO2 98%   BMI 21.31 kg/m  Gen: awake, alert, appearing stated age Lungs: No accessory muscle use Skin: Perianal tissue over the medial and proximal border of the nail is slightly edematous with TTP and a pinkish hue. No drainage, erythema, fluctuance, excoriation Psych: Age appropriate judgment and insight  Paronychia of right thumb - Plan: cephALEXin  (KEFLEX ) 500 MG capsule  7 days of Keflex , Betadine/Epsom salt soaks 1-2 times daily for 10 to 15 minutes.  She will let me know if things are not turning the corner.  I do not think she needs any I&D today. F/u prn. The patient voiced understanding and agreement to the plan.  [name], DO 05/12/23 11:26 AM

## 2023-05-12 NOTE — Patient Instructions (Addendum)
 Epsom salt or betadine soaks 1-2 times daily for 10-15 minute at a time. Do this for the next 7-10 days.   Let me know if things aren't turning the corner.   Let us know if you need anything.

## 2023-05-16 ENCOUNTER — Other Ambulatory Visit: Payer: Self-pay | Admitting: Family Medicine

## 2023-05-16 DIAGNOSIS — M25551 Pain in right hip: Secondary | ICD-10-CM

## 2023-06-04 ENCOUNTER — Ambulatory Visit: Payer: Medicare HMO

## 2023-06-04 VITALS — Ht 66.0 in | Wt 132.0 lb

## 2023-06-04 DIAGNOSIS — Z0000 Encounter for general adult medical examination without abnormal findings: Secondary | ICD-10-CM | POA: Diagnosis not present

## 2023-06-04 NOTE — Progress Notes (Signed)
Subjective:   Michelle Rosales is a 68 y.o. female who presents for Medicare Annual (Subsequent) preventive examination.  Visit Complete: Virtual I connected with  Michelle Rosales on 06/04/23 by a audio enabled telemedicine application and verified that I am speaking with the correct person using two identifiers.  Patient Location: Home  Provider Location: Home Office  I discussed the limitations of evaluation and management by telemedicine. The patient expressed understanding and agreed to proceed.  Vital Signs: Because this visit was a virtual/telehealth visit, some criteria may be missing or patient reported. Any vitals not documented were not able to be obtained and vitals that have been documented are patient reported.  Patient Medicare AWV questionnaire was completed by the patient on 05/28/23; I have confirmed that all information answered by patient is correct and no changes since this date.  Cardiac Risk Factors include: advanced age (>59men, >68 women)     Objective:    Today's Vitals   06/04/23 0931  Weight: 132 lb (59.9 kg)  Height: 5\' 6"  (1.676 m)   Body mass index is 21.31 kg/m.     06/04/2023    9:37 AM 03/21/2022    2:25 PM 03/19/2021    3:12 PM 10/12/2012   12:48 PM 09/22/2012   11:13 AM  Advanced Directives  Does Patient Have a Medical Advance Directive? Yes Yes Yes Patient has advance directive, copy not in chart Patient has advance directive, copy not in chart  Type of Advance Directive Healthcare Power of [place];Living will Healthcare Power of [place];Living will Healthcare Power of [place];Living will Healthcare Power of [employer] of [place];Living will  Does patient want to make changes to medical advance directive?  No - Patient declined     Copy of Healthcare Power of Attorney in Chart? No - copy requested No - copy requested No - copy requested Copy requested from family Copy requested from family    Current Medications  (verified) Outpatient Encounter Medications as of 06/04/2023  Medication Sig   acetaminophen (TYLENOL) 325 MG tablet Take by mouth.   cetirizine (ZYRTEC) 10 MG tablet Take 10 mg by mouth daily.   diclofenac (VOLTAREN) 75 MG EC tablet Take 1 tablet by mouth twice daily as needed   fluticasone (FLONASE) 50 MCG/ACT nasal spray Place 2 sprays into both nostrils daily.   pravastatin (PRAVACHOL) 20 MG tablet Take 1 tablet (20 mg total) by mouth daily.   predniSONE (DELTASONE) 20 MG tablet Take 40 mg by mouth daily for 3 days, then 20 mg by mouth daily for 3 days   No facility-administered encounter medications on file as of 06/04/2023.    Allergies (verified) Venlafaxine   History: Past Medical History:  Diagnosis Date   GERD (gastroesophageal reflux disease)    Past Surgical History:  Procedure Laterality Date   ANAL FISSURE REPAIR     BREAST SURGERY  Maybe 6 years ago.   non cancerous lump removed from left breast   COLONOSCOPY WITH PROPOFOL N/A 10/12/2012   Procedure: COLONOSCOPY WITH PROPOFOL;  Surgeon: Charolett Bumpers, MD;  Location: [facility];  Service: Endoscopy;  Laterality: N/A;   Family History  Problem Relation Age of Onset   Arthritis Mother    Asthma Brother    Cancer Sister    Cancer Sister    Social History   Socioeconomic History   Marital status: Married    Spouse name: Not on file   Number of children: Not on file   Years of  education: Not on file   Highest education level: Master's degree (e.g., MA, MS, MEng, MEd, MSW, MBA)  Occupational History   Not on file  Tobacco Use   Smoking status: Never   Smokeless tobacco: Never  Substance and Sexual Activity   Alcohol use: Yes    Comment: occassionally   Drug use: No   Sexual activity: Not on file  Other Topics Concern   Not on file  Social History Narrative   Not on file   Social Drivers of Health   Financial Resource Strain: Low Risk  (06/04/2023)   Overall Financial Resource Strain (CARDIA)     Difficulty of Paying Living Expenses: Not hard at all  Food Insecurity: No Food Insecurity (06/04/2023)   Hunger Vital Sign    Worried About Running Out of Food in the Last Year: Never true    Ran Out of Food in the Last Year: Never true  Transportation Needs: No Transportation Needs (06/04/2023)   PRAPARE - Administrator, Civil Service (Medical): No    Lack of Transportation (Non-Medical): No  Physical Activity: Sufficiently Active (06/04/2023)   Exercise Vital Sign    Days of Exercise per Week: 7 days    Minutes of Exercise per Session: 50 min  Stress: No Stress Concern Present (06/04/2023)   [employer] of Occupational Health - Occupational Stress Questionnaire    Feeling of Stress : Not at all  Social Connections: Socially Integrated (06/04/2023)   Social Connection and Isolation Panel [NHANES]    Frequency of Communication with Friends and Family: More than three times a week    Frequency of Social Gatherings with Friends and Family: More than three times a week    Attends Religious Services: More than 4 times per year    Active Member of [employer] or Organizations: Yes    Attends Engineer, structural: More than 4 times per year    Marital Status: Married    Tobacco Counseling Counseling given: Not Answered   Clinical Intake:  Pre-visit preparation completed: Yes  Pain : No/denies pain     BMI - recorded: 21.31 Nutritional Status: BMI of 19-24  Normal Nutritional Risks: None Diabetes: No  How often do you need to have someone help you when you read instructions, pamphlets, or other written materials from your doctor or pharmacy?: 1 - Never  Interpreter Needed?: No  Information entered by :: Theresa Mulligan LPN   Activities of Daily Living    06/04/2023    9:36 AM 05/28/2023    9:53 AM  In your present state of health, do you have any difficulty performing the following activities:  Hearing? 0 0  Vision? 0 0  Difficulty concentrating or  making decisions? 0 0  Walking or climbing stairs? 0 0  Dressing or bathing? 0 0  Doing errands, shopping? 0 0  Preparing Food and eating ? N N  Using the Toilet? N N  In the past six months, have you accidently leaked urine? N N  Do you have problems with loss of bowel control? N N  Managing your Medications? N N  Managing your Finances? N N  Housekeeping or managing your Housekeeping? N N    Patient Care Team: Copland, Gwenlyn Found, MD as PCP - General (Family Medicine)  Indicate any recent Medical Services you may have received from other than [facility] providers in the past year (date may be approximate).     Assessment:   This is a routine  wellness examination for [place].  Hearing/Vision screen Hearing Screening - Comments:: Denies hearing difficulties   Vision Screening - Comments:: Wears rx glasses - up to date with routine eye exams with  [facility]   Goals Addressed               This Visit's Progress     Stay Active (pt-stated)        Drink plenty of water.       Depression Screen    06/04/2023    9:35 AM 09/17/2022    8:41 AM 06/10/2022    2:46 PM 03/21/2022    2:25 PM 03/19/2021    3:17 PM 10/24/2020    9:43 AM  PHQ 2/9 Scores  PHQ - 2 Score 0 0 0 0 0 0    Fall Risk    06/04/2023    9:36 AM 05/28/2023    9:53 AM 09/17/2022    8:40 AM 06/10/2022    2:46 PM 03/21/2022    2:26 PM  Fall Risk   Falls in the past year? 0 0 0 0 0  Number falls in past yr: 0 0 0 0 0  Injury with Fall? 0  0 0 0  Risk for fall due to : No Fall Risks  No Fall Risks No Fall Risks No Fall Risks  Follow up Falls prevention discussed  Falls evaluation completed Falls evaluation completed Falls evaluation completed    MEDICARE RISK AT HOME: Medicare Risk at Home Any stairs in or around the home?: Yes If so, are there any without handrails?: No Home free of loose throw rugs in walkways, pet beds, electrical cords, etc?: Yes Adequate lighting in your home to reduce risk of  falls?: Yes Life alert?: No Use of a cane, walker or w/c?: No Grab bars in the bathroom?: Yes Shower chair or bench in shower?: Yes Elevated toilet seat or a handicapped toilet?: No  TIMED UP AND GO:  Was the test performed?  No    Cognitive Function:        06/04/2023    9:37 AM 03/21/2022    2:31 PM  6CIT Screen  What Year? 0 points 0 points  What month? 0 points 0 points  What time? 0 points 0 points  Count back from 20 0 points 0 points  Months in reverse 0 points 0 points  Repeat phrase 0 points 4 points  Total Score 0 points 4 points    Immunizations Immunization History  Administered Date(s) Administered   Fluad Quad(high Dose 65+) 02/06/2021   Influenza Split 02/03/2017, 02/09/2018, 02/06/2021   Influenza, High Dose Seasonal PF 02/04/2016, 01/28/2019   Influenza-Unspecified 02/03/2017, 02/09/2018, 02/01/2022, 01/04/2023   PFIZER(Purple Top)SARS-COV-2 Vaccination 06/02/2019, 06/28/2019, 01/30/2020, 11/15/2020   Pfizer Covid-19 Vaccine Bivalent Booster 24yrs & up 02/06/2021, 09/09/2021   Pneumococcal Conjugate-13 09/13/2019   Pneumococcal Polysaccharide-23 04/08/2021   Tdap 06/06/2015, 06/10/2022   Zoster Recombinant(Shingrix) 07/31/2020, 12/12/2020    TDAP status: Up to date  Flu Vaccine status: Up to date  Pneumococcal vaccine status: Up to date  Covid-19 vaccine status: Declined, Education has been provided regarding the importance of this vaccine but patient still declined. Advised may receive this vaccine at local pharmacy or Health Dept.or vaccine clinic. Aware to provide a copy of the vaccination record if obtained from local pharmacy or Health Dept. Verbalized acceptance and understanding.  Qualifies for Shingles Vaccine? Yes   Zostavax completed Yes   Shingrix Completed?: Yes  Screening Tests Health Maintenance  Topic Date Due   COVID-19 Vaccine (7 - 2024-25 season) 01/04/2023   Medicare Annual Wellness (AWV)  06/03/2024   MAMMOGRAM   10/12/2024   DTaP/Tdap/Td (3 - Td or Tdap) 06/10/2032   Colonoscopy  10/12/2032   Pneumonia Vaccine 52+ Years old  Completed   INFLUENZA VACCINE  Completed   DEXA SCAN  Completed   Hepatitis C Screening  Completed   Zoster Vaccines- Shingrix  Completed   HPV VACCINES  Aged Out    Health Maintenance  Health Maintenance Due  Topic Date Due   COVID-19 Vaccine (7 - 2024-25 season) 01/04/2023    Colorectal cancer screening: Type of screening: Cologuard. Completed 10/13/22. Repeat every 3 years  Mammogram status: Completed 10/13/22. Repeat every year  Bone Density status: Completed 10/02/22. Results reflect: Bone density results: OSTEOPENIA. Repeat every   years.    Additional Screening:  Hepatitis C Screening: does qualify; Completed 11/14/19  Vision Screening: Recommended annual ophthalmology exams for early detection of glaucoma and other disorders of the eye. Is the patient up to date with their annual eye exam?  Yes  Who is the provider or what is the name of the office in which the patient attends annual eye exams? [facility] If pt is not established with a provider, would they like to be referred to a provider to establish care? No .   Dental Screening: Recommended annual dental exams for proper oral hygiene    Community Resource Referral / Chronic Care Management:  CRR required this visit?  No   CCM required this visit?  No     Plan:     I have personally reviewed and noted the following in the patient's chart:   Medical and social history Use of alcohol, tobacco or illicit drugs  Current medications and supplements including opioid prescriptions. Patient is not currently taking opioid prescriptions. Functional ability and status Nutritional status Physical activity Advanced directives List of other physicians Hospitalizations, surgeries, and ER visits in previous 12 months Vitals Screenings to include cognitive, depression, and falls Referrals and  appointments  In addition, I have reviewed and discussed with patient certain preventive protocols, quality metrics, and best practice recommendations. A written personalized care plan for preventive services as well as general preventive health recommendations were provided to patient.     Tillie Rung, LPN   1/61/0960   After Visit Summary: (MyChart) Due to this being a telephonic visit, the after visit summary with patients personalized plan was offered to patient via MyChart   Nurse Notes: None

## 2023-06-04 NOTE — Patient Instructions (Addendum)
Ms. Cheramie , Thank you for taking time to come for your Medicare Wellness Visit. I appreciate your ongoing commitment to your health goals. Please review the following plan we discussed and let me know if I can assist you in the future.   Referrals/Orders/Follow-Ups/Clinician Recommendations:   This is a list of the screening recommended for you and due dates:  Health Maintenance  Topic Date Due   COVID-19 Vaccine (7 - 2024-25 season) 01/04/2023   Medicare Annual Wellness Visit  06/03/2024   Mammogram  10/12/2024   DTaP/Tdap/Td vaccine (3 - Td or Tdap) 06/10/2032   Colon Cancer Screening  10/12/2032   Pneumonia Vaccine  Completed   Flu Shot  Completed   DEXA scan (bone density measurement)  Completed   Hepatitis C Screening  Completed   Zoster (Shingles) Vaccine  Completed   HPV Vaccine  Aged Out    Advanced directives: (Copy Requested) Please bring a copy of your health care power of attorney and living will to the office to be added to your chart at your convenience.  Next Medicare Annual Wellness Visit scheduled for next year: Yes

## 2023-06-23 ENCOUNTER — Encounter (INDEPENDENT_AMBULATORY_CARE_PROVIDER_SITE_OTHER): Payer: Medicare HMO | Admitting: Family Medicine

## 2023-06-23 DIAGNOSIS — L03019 Cellulitis of unspecified finger: Secondary | ICD-10-CM

## 2023-06-23 MED ORDER — DOXYCYCLINE HYCLATE 100 MG PO CAPS: 100.0000 mg | ORAL_CAPSULE | Freq: Two times a day (BID) | ORAL | 0 refills | Status: DC

## 2023-06-23 NOTE — Telephone Encounter (Signed)
Thursday maybe?

## 2023-06-23 NOTE — Addendum Note (Signed)
Addended by: Abbe Amsterdam C on: 06/23/2023 01:07 PM   Modules accepted: Orders

## 2023-06-23 NOTE — Telephone Encounter (Signed)

## 2023-08-11 ENCOUNTER — Other Ambulatory Visit: Payer: Self-pay | Admitting: Family Medicine

## 2023-08-11 DIAGNOSIS — M25551 Pain in right hip: Secondary | ICD-10-CM

## 2023-09-09 ENCOUNTER — Other Ambulatory Visit (HOSPITAL_BASED_OUTPATIENT_CLINIC_OR_DEPARTMENT_OTHER): Payer: Self-pay

## 2023-09-09 MED ORDER — COVID-19 MRNA VAC-TRIS(PFIZER) 30 MCG/0.3ML IM SUSY
0.3000 mL | PREFILLED_SYRINGE | Freq: Once | INTRAMUSCULAR | 0 refills | Status: AC
  Filled 2023-09-09: qty 0.3, 1d supply, fill #0

## 2023-09-15 NOTE — Progress Notes (Addendum)
 [facility] [address], [address] [place], [place] [address] [phone]  Date:  09/21/2023   Name:  Michelle Rosales   DOB:  1955-12-03   MRN:  [id number]  PCP:  Kaylee Partridge, MD    Chief Complaint: Annual Exam   History of Present Illness:  Michelle Rosales is a 68 y.o. very pleasant female patient who presents with the following:  Pt seen today for CPE Last seen by myself in October History of pre-diabetes and osteopenia   Labs one year ago - she is fasting this am   Mammo UTD Dexa 5/24 Colon- cologuard one year ago  Pap: she thinks her last was 20 years ago, she did not have a hysterectomy Never had an abnormal  We will update today  She just had a covid booster 2 weeks ago   Pravastatin -patient notes she is taking every other day.  If her cholesterol has gone up she is happy to increase again to today Allergy meds   She enjoys walking daily- about an hour daily No CP or SOB with exercise   She will get a recurrent infection of her right thumb-we have been dealing with this since January  We treatd with keflex  and doxy- it did clear up but then seemed to return.  We treated her with doxycycline  and again got better temporarily.  She is not aware of any particular cause of this problem. She is concerned as they will be going on a 9-day trip to [place] in about a week  Patient Active Problem List   Diagnosis Date Noted   Osteopenia 10/02/2022   Pre-diabetes 10/24/2020    Past Medical History:  Diagnosis Date   GERD (gastroesophageal reflux disease)     Past Surgical History:  Procedure Laterality Date   ANAL FISSURE REPAIR     BREAST SURGERY  Maybe 6 years ago.   non cancerous lump removed from left breast   COLONOSCOPY WITH PROPOFOL  N/A 10/12/2012   Procedure: COLONOSCOPY WITH PROPOFOL ;  Surgeon: Garrett Kallman, MD;  Location: [facility];  Service: Endoscopy;  Laterality: N/A;    Social  History   Tobacco Use   Smoking status: Never   Smokeless tobacco: Never  Substance Use Topics   Alcohol use: Yes    Comment: occassionally   Drug use: No    Family History  Problem Relation Age of Onset   Arthritis Mother    Asthma Brother    Cancer Sister    Cancer Sister     Allergies  Allergen Reactions   Venlafaxine Itching    Medication list has been reviewed and updated.  Current Outpatient Medications on File Prior to Visit  Medication Sig Dispense Refill   acetaminophen (TYLENOL) 325 MG tablet Take by mouth.     cetirizine (ZYRTEC) 10 MG tablet Take 10 mg by mouth daily.     diclofenac  (VOLTAREN ) 75 MG EC tablet Take 1 tablet by mouth twice daily as needed 180 tablet 0   fluticasone  (FLONASE ) 50 MCG/ACT nasal spray Place 2 sprays into both nostrils daily. 16 g 6   pravastatin  (PRAVACHOL ) 20 MG tablet Take 1 tablet (20 mg total) by mouth daily. 90 tablet 2   No current facility-administered medications on file prior to visit.    Review of Systems:  As per HPI- otherwise negative.   Physical Examination: Vitals:   09/21/23 0817  BP: 120/70  Pulse: 72  Temp: 98.1 F (36.7 C)  SpO2: 99%   Vitals:   09/21/23 0817  Weight: 127 lb 3.2 oz (57.7 kg)  Height: 5\' 6"  (1.676 m)   Body mass index is 20.53 kg/m. Ideal Body Weight: Weight in (lb) to have BMI = 25: 154.6  GEN: no acute distress. Slim build, looks well  HEENT: Atraumatic, Normocephalic.  Ears and Nose: No external deformity. CV: RRR, No M/G/R. No JVD. No thrill. No extra heart sounds. PULM: CTA B, no wheezes, crackles, rhonchi. No retractions. No resp. distress. No accessory muscle use. ABD: S, NT, ND, +BS. No rebound. No HSM. EXTR: No c/c/e PSYCH: Normally interactive. Conversant.  Normal vulva, vagina, cervix There is mild erythema and tenderness around the right cuticle, consistent with a mild paronychia   Assessment and Plan: Physical exam  Pre-diabetes - Plan: Comprehensive  metabolic panel with GFR, Hemoglobin A1c  Thyroid  disorder screening - Plan: TSH  Screening for deficiency anemia - Plan: CBC  Dyslipidemia - Plan: Lipid panel  Screening for cervical cancer - Plan: Cytology - PAP  Paronychia of right thumb - Plan: doxycycline  (VIBRAMYCIN ) 100 MG capsule  Physical exam today- encouraged healthy diet and exercise routine  Will plan further follow- up pending labs. Patient notes a recurrent/persistent paronychia of the right thumb.  She did respond to doxycycline  but then symptoms returned.  Will have her try a longer course of treatment; 100 mg twice daily for 10 days, then daily to complete 30 days of treatment. I have asked her to please let me know how this works for her, if this is not ready for symptoms long-term we can have her see orthopedics  Signed Gates Kasal, MD  Addendum 5/20, received labs as below, message patient  Results for orders placed or performed in visit on 09/21/23  CBC   Collection Time: 09/21/23  8:51 AM  Result Value Ref Range   WBC 3.3 (L) 4.0 - 10.5 K/uL   RBC 3.91 3.87 - 5.11 Mil/uL   Platelets 180.0 150.0 - 400.0 K/uL   Hemoglobin 12.2 12.0 - 15.0 g/dL   HCT 60.4 54.0 - 98.1 %   MCV 92.1 78.0 - 100.0 fl   MCHC 33.8 30.0 - 36.0 g/dL   RDW 19.1 47.8 - 29.5 %  Comprehensive metabolic panel with GFR   Collection Time: 09/21/23  8:51 AM  Result Value Ref Range   Sodium 138 135 - 145 mEq/L   Potassium 4.3 3.5 - 5.1 mEq/L   Chloride 102 96 - 112 mEq/L   CO2 30 19 - 32 mEq/L   Glucose, Bld 89 70 - 99 mg/dL   BUN 19 6 - 23 mg/dL   Creatinine, Ser 6.21 0.40 - 1.20 mg/dL   Total Bilirubin 0.7 0.2 - 1.2 mg/dL   Alkaline Phosphatase 71 39 - 117 U/L   AST 28 0 - 37 U/L   ALT 19 0 - 35 U/L   Total Protein 7.0 6.0 - 8.3 g/dL   Albumin 4.5 3.5 - 5.2 g/dL   GFR 30.86 >57.84 mL/min   Calcium 9.4 8.4 - 10.5 mg/dL  Hemoglobin O9G   Collection Time: 09/21/23  8:51 AM  Result Value Ref Range   Hgb A1c MFr Bld 6.0 4.6 -  6.5 %  Lipid panel   Collection Time: 09/21/23  8:51 AM  Result Value Ref Range   Cholesterol 188 0 - 200 mg/dL   Triglycerides 29.5 0.0 - 149.0 mg/dL   HDL 28.41 >32.44 mg/dL  VLDL 18.0 0.0 - 40.0 mg/dL   LDL Cholesterol 98 0 - 99 mg/dL   Total CHOL/HDL Ratio 3    NonHDL 116.16   TSH   Collection Time: 09/21/23  8:51 AM  Result Value Ref Range   TSH 1.29 0.35 - 5.50 uIU/mL

## 2023-09-15 NOTE — Patient Instructions (Addendum)
 It was good to see you today - have a wonderful trip to [place] coming up!  I will be in touch with your labs and pap asap We can do a bone density for you next year For your thumb- let's have you take doxycycline  twice daily for 10 days, then once a day to complete 20 more days (one month total) Please let me know if this does not clear things up Also please be sure not pick at the skin around your thumb and apply a topical antibiotic ointment to the area once a day

## 2023-09-21 ENCOUNTER — Ambulatory Visit (INDEPENDENT_AMBULATORY_CARE_PROVIDER_SITE_OTHER): Payer: Medicare HMO | Admitting: Family Medicine

## 2023-09-21 ENCOUNTER — Encounter: Payer: Self-pay | Admitting: Family Medicine

## 2023-09-21 ENCOUNTER — Other Ambulatory Visit (HOSPITAL_COMMUNITY)
Admission: RE | Admit: 2023-09-21 | Discharge: 2023-09-21 | Disposition: A | Discharge status: Home / Self Care | Source: Ambulatory Visit | Attending: Family Medicine | Admitting: Family Medicine

## 2023-09-21 VITALS — BP 120/70 | HR 72 | Temp 98.1°F | Ht 66.0 in | Wt 127.2 lb

## 2023-09-21 DIAGNOSIS — L03011 Cellulitis of right finger: Secondary | ICD-10-CM

## 2023-09-21 DIAGNOSIS — Z1151 Encounter for screening for human papillomavirus (HPV): Secondary | ICD-10-CM | POA: Insufficient documentation

## 2023-09-21 DIAGNOSIS — Z01419 Encounter for gynecological examination (general) (routine) without abnormal findings: Secondary | ICD-10-CM | POA: Insufficient documentation

## 2023-09-21 DIAGNOSIS — Z130 Encounter for screening for diseases of the blood and blood-forming organs and certain disorders involving the immune mechanism: Secondary | ICD-10-CM

## 2023-09-21 DIAGNOSIS — R7303 Prediabetes: Secondary | ICD-10-CM

## 2023-09-21 DIAGNOSIS — E785 Hyperlipidemia, unspecified: Secondary | ICD-10-CM | POA: Diagnosis not present

## 2023-09-21 DIAGNOSIS — D72819 Decreased white blood cell count, unspecified: Secondary | ICD-10-CM

## 2023-09-21 DIAGNOSIS — Z1329 Encounter for screening for other suspected endocrine disorder: Secondary | ICD-10-CM

## 2023-09-21 DIAGNOSIS — Z0000 Encounter for general adult medical examination without abnormal findings: Secondary | ICD-10-CM

## 2023-09-21 DIAGNOSIS — Z124 Encounter for screening for malignant neoplasm of cervix: Secondary | ICD-10-CM | POA: Diagnosis not present

## 2023-09-21 LAB — HEMOGLOBIN A1C: Hgb A1c MFr Bld: 6 % (ref 4.6–6.5)

## 2023-09-21 LAB — COMPREHENSIVE METABOLIC PANEL WITH GFR
ALT: 19 U/L (ref 0–35)
AST: 28 U/L (ref 0–37)
Albumin: 4.5 g/dL (ref 3.5–5.2)
Alkaline Phosphatase: 71 U/L (ref 39–117)
BUN: 19 mg/dL (ref 6–23)
CO2: 30 meq/L (ref 19–32)
Calcium: 9.4 mg/dL (ref 8.4–10.5)
Chloride: 102 meq/L (ref 96–112)
Creatinine, Ser: 0.74 mg/dL (ref 0.40–1.20)
GFR: 82.26 mL/min (ref >60.00)
Glucose, Bld: 89 mg/dL (ref 70–99)
Potassium: 4.3 meq/L (ref 3.5–5.1)
Sodium: 138 meq/L (ref 135–145)
Total Bilirubin: 0.7 mg/dL (ref 0.2–1.2)
Total Protein: 7 g/dL (ref 6.0–8.3)

## 2023-09-21 LAB — CBC
HCT: 36 % (ref 36.0–46.0)
Hemoglobin: 12.2 g/dL (ref 12.0–15.0)
MCHC: 33.8 g/dL (ref 30.0–36.0)
MCV: 92.1 fl (ref 78.0–100.0)
Platelets: 180 10*3/uL (ref 150.0–400.0)
RBC: 3.91 Mil/uL (ref 3.87–5.11)
RDW: 13.5 % (ref 11.5–15.5)
WBC: 3.3 10*3/uL — ABNORMAL LOW (ref 4.0–10.5)

## 2023-09-21 LAB — LIPID PANEL
Cholesterol: 188 mg/dL (ref 0–200)
HDL: 72.2 mg/dL (ref >39.00)
LDL Cholesterol: 98 mg/dL (ref 0–99)
NonHDL: 116.16
Total CHOL/HDL Ratio: 3
Triglycerides: 90 mg/dL (ref 0.0–149.0)
VLDL: 18 mg/dL (ref 0.0–40.0)

## 2023-09-21 MED ORDER — DOXYCYCLINE HYCLATE 100 MG PO CAPS: 100.0000 mg | ORAL_CAPSULE | Freq: Two times a day (BID) | ORAL | 0 refills | Status: DC

## 2023-09-22 ENCOUNTER — Encounter: Payer: Self-pay | Admitting: Family Medicine

## 2023-09-22 LAB — TSH: TSH: 1.29 u[IU]/mL (ref 0.35–5.50)

## 2023-09-22 NOTE — Addendum Note (Signed)
 Addended by: Gates Kasal C on: 09/22/2023 04:54 PM   Modules accepted: Orders

## 2023-09-24 ENCOUNTER — Encounter: Payer: Self-pay | Admitting: Family Medicine

## 2023-09-24 LAB — CYTOLOGY - PAP
Adequacy: ABSENT
Comment: NEGATIVE
Diagnosis: NEGATIVE
High risk HPV: NEGATIVE

## 2023-10-15 DIAGNOSIS — Z1231 Encounter for screening mammogram for malignant neoplasm of breast: Secondary | ICD-10-CM | POA: Diagnosis not present

## 2023-10-15 LAB — HM MAMMOGRAPHY

## 2023-10-19 DIAGNOSIS — H0288A Meibomian gland dysfunction right eye, upper and lower eyelids: Secondary | ICD-10-CM | POA: Diagnosis not present

## 2023-10-19 DIAGNOSIS — H1013 Acute atopic conjunctivitis, bilateral: Secondary | ICD-10-CM | POA: Diagnosis not present

## 2023-10-19 DIAGNOSIS — H0288B Meibomian gland dysfunction left eye, upper and lower eyelids: Secondary | ICD-10-CM | POA: Diagnosis not present

## 2023-10-19 DIAGNOSIS — H04123 Dry eye syndrome of bilateral lacrimal glands: Secondary | ICD-10-CM | POA: Diagnosis not present

## 2023-10-19 DIAGNOSIS — H11152 Pinguecula, left eye: Secondary | ICD-10-CM | POA: Diagnosis not present

## 2023-10-23 ENCOUNTER — Encounter: Payer: Self-pay | Admitting: Family Medicine

## 2023-11-02 ENCOUNTER — Encounter: Payer: Self-pay | Admitting: Family Medicine

## 2023-11-02 DIAGNOSIS — L03019 Cellulitis of unspecified finger: Secondary | ICD-10-CM

## 2023-11-03 NOTE — Addendum Note (Signed)
 Addended by: WATT RAISIN C on: 11/03/2023 10:57 AM   Modules accepted: Orders

## 2023-11-04 DIAGNOSIS — M79644 Pain in right finger(s): Secondary | ICD-10-CM | POA: Diagnosis not present

## 2023-11-05 MED ORDER — FLUCONAZOLE 150 MG PO TABS: ORAL_TABLET | ORAL | 0 refills | Status: DC

## 2023-11-05 NOTE — Addendum Note (Signed)
 Addended by: WATT RAISIN C on: 11/05/2023 12:52 PM   Modules accepted: Orders

## 2023-11-07 ENCOUNTER — Other Ambulatory Visit: Payer: Self-pay | Admitting: Family Medicine

## 2023-11-07 DIAGNOSIS — E785 Hyperlipidemia, unspecified: Secondary | ICD-10-CM

## 2023-11-24 ENCOUNTER — Other Ambulatory Visit (INDEPENDENT_AMBULATORY_CARE_PROVIDER_SITE_OTHER)

## 2023-11-24 ENCOUNTER — Encounter: Payer: Self-pay | Admitting: Family Medicine

## 2023-11-24 DIAGNOSIS — D72819 Decreased white blood cell count, unspecified: Secondary | ICD-10-CM

## 2023-11-24 LAB — CBC
HCT: 33.9 % — ABNORMAL LOW (ref 36.0–46.0)
Hemoglobin: 11.3 g/dL — ABNORMAL LOW (ref 12.0–15.0)
MCHC: 33.5 g/dL (ref 30.0–36.0)
MCV: 92.1 fl (ref 78.0–100.0)
Platelets: 176 K/uL (ref 150.0–400.0)
RBC: 3.68 Mil/uL — ABNORMAL LOW (ref 3.87–5.11)
RDW: 13.5 % (ref 11.5–15.5)
WBC: 3.6 K/uL — ABNORMAL LOW (ref 4.0–10.5)

## 2023-11-25 DIAGNOSIS — H524 Presbyopia: Secondary | ICD-10-CM | POA: Diagnosis not present

## 2023-11-25 DIAGNOSIS — H47393 Other disorders of optic disc, bilateral: Secondary | ICD-10-CM | POA: Diagnosis not present

## 2023-11-25 DIAGNOSIS — H52203 Unspecified astigmatism, bilateral: Secondary | ICD-10-CM | POA: Diagnosis not present

## 2023-11-25 DIAGNOSIS — H5203 Hypermetropia, bilateral: Secondary | ICD-10-CM | POA: Diagnosis not present

## 2023-11-25 DIAGNOSIS — H04123 Dry eye syndrome of bilateral lacrimal glands: Secondary | ICD-10-CM | POA: Diagnosis not present

## 2023-11-25 DIAGNOSIS — H2513 Age-related nuclear cataract, bilateral: Secondary | ICD-10-CM | POA: Diagnosis not present

## 2023-11-25 DIAGNOSIS — H40003 Preglaucoma, unspecified, bilateral: Secondary | ICD-10-CM | POA: Diagnosis not present

## 2023-11-25 DIAGNOSIS — H43393 Other vitreous opacities, bilateral: Secondary | ICD-10-CM | POA: Diagnosis not present

## 2023-12-08 NOTE — Progress Notes (Addendum)
 [facility] [address] [place], [place] [address] [phone]  Date:  12/14/2023   Name:  Michelle Rosales   DOB:  Sep 19, 1955   MRN:  [id number]  PCP:  Watt Harlene JAYSON, MD    Chief Complaint: Follow-up (Go over lab results )   History of Present Illness:  Michelle Rosales is a 68 y.o. very pleasant female patient who presents with the following:  Patient seen today for lab follow-up I saw her most recently this past May for physical History of pre-diabetes and osteopenia   At that time we were dealing with an infection in her thumb.  We have been following her CBC and had noted some minor abnormalities.In May she had mild leukopenia with white count of 3.3.  On recheck in July her white count was slightly improved but her hemoglobin/ hematocrit had dipped slightly- we will recheck these 2 things for her today   She does donate blood on a regular basis- most recent donation earlier this summer   Her thumb is finally better - she finished the abx and anti-fungals and notes that her thumb is no longer sore and seems to be returning to normal.  She will keep a close eye on this  Lab Results  Component Value Date   HGBA1C 6.0 09/21/2023    Michelle Rosales is concerned about her weight.  When she stepped on the scale today she realized she has lost close to 10 pounds unintentionally Wt Readings from Last 3 Encounters:  12/14/23 125 lb 9.6 oz (57 kg)  09/21/23 127 lb 3.2 oz (57.7 kg)  06/04/23 132 lb (59.9 kg)   One year ago 132 2 years ago 134  She is not trying to lose weight She is not eating less overall but she is eating less meat than in the past  She notes a family history of glioblastoma- her sister died of same, diagnosed in her early 69s She notes she may get an unusual HA off and on and her right eye has been hurting her some She was told this was due to dry eyes- she went to her eye doctor twice  She does  feel like her right lid is slow to open She has not noticed any other neurologic abnormalities, no numbness or weakness of any of her limbs, no slurred speech or facial drooping Over the last few months she has noticed her head hurting more in 1 spot over her right superior skull, different than her usual headache Patient Active Problem List   Diagnosis Date Noted   Osteopenia 10/02/2022   Pre-diabetes 10/24/2020    Past Medical History:  Diagnosis Date   GERD (gastroesophageal reflux disease)     Past Surgical History:  Procedure Laterality Date   ANAL FISSURE REPAIR     BREAST SURGERY  Maybe 6 years ago.   non cancerous lump removed from left breast   COLONOSCOPY WITH PROPOFOL  N/A 10/12/2012   Procedure: COLONOSCOPY WITH PROPOFOL ;  Surgeon: Gladis MARLA Louder, MD;  Location: [facility];  Service: Endoscopy;  Laterality: N/A;    Social History   Tobacco Use   Smoking status: Never   Smokeless tobacco: Never  Substance Use Topics   Alcohol use: Yes    Comment: occassionally   Drug use: No    Family History  Problem Relation Age of Onset   Arthritis Mother    Asthma Brother    Cancer  Sister    Cancer Sister     Allergies  Allergen Reactions   Venlafaxine Itching    Medication list has been reviewed and updated.  Current Outpatient Medications on File Prior to Visit  Medication Sig Dispense Refill   acetaminophen (TYLENOL) 325 MG tablet Take by mouth.     cetirizine (ZYRTEC) 10 MG tablet Take 10 mg by mouth daily.     diclofenac  (VOLTAREN ) 75 MG EC tablet Take 1 tablet by mouth twice daily as needed 180 tablet 0   fluticasone  (FLONASE ) 50 MCG/ACT nasal spray Place 2 sprays into both nostrils daily. 16 g 6   pravastatin  (PRAVACHOL ) 20 MG tablet Take 1 tablet (20 mg total) by mouth daily. 90 tablet 2   No current facility-administered medications on file prior to visit.    Review of Systems:  As per HPI- otherwise negative.   Physical Examination: Vitals:    12/14/23 0855  BP: 112/70  Pulse: 73  SpO2: 95%   Vitals:   12/14/23 0855  Weight: 125 lb 9.6 oz (57 kg)  Height: 5' 6 (1.676 m)   Body mass index is 20.27 kg/m. Ideal Body Weight: Weight in (lb) to have BMI = 25: 154.6  GEN: no acute distress.  Petite build, looks well HEENT: Atraumatic, Normocephalic.  Ears and Nose: No external deformity. CV: RRR, No M/G/R. No JVD. No thrill. No extra heart sounds. PULM: CTA B, no wheezes, crackles, rhonchi. No retractions. No resp. distress. No accessory muscle use. EXTR: No c/c/e PSYCH: Normally interactive. Conversant.  Her right thumb around the nail still slightly larger than the left but does not appear acutely infected  Assessment and Plan: Leukopenia, unspecified type - Plan: CBC with Differential/Platelet  Mild anemia - Plan: Ferritin  Weight loss - Plan: TSH, Basic metabolic panel with GFR  Family history of brain cancer - Plan: CT HEAD W CONTRAST ( )  Pain of right eye - Plan: CT HEAD W CONTRAST ( )  Patient seen today for follow-up.  We have been following some mild leukopenia and mild anemia.  I had not realized that she does regularly donate blood I would certainly make explain mild anemia seen earlier this year.  Labs are pending as above  Patient has unintentionally lost between 7 and 10 pounds.  She does not have any other particular symptoms that she can put her finger on except for change in head pain and some eye discomfort as described above.  Her sister does have history of glioblastoma.  I ordered a CT of her head to rule out a tumor.  We may also need to pursue a CT chest, abdomen, pelvis for weight loss  I have asked her to see if she can gain weight intentionally, she also will check her weight once or twice a week and keep a record  Signed Harlene Schroeder, MD  Received her labs, message to patient  Results for orders placed or performed in visit on 12/14/23  CBC with Differential/Platelet    Collection Time: 12/14/23  9:36 AM  Result Value Ref Range   WBC 4.7 3.8 - 10.8 Thousand/uL   RBC 3.82 3.80 - 5.10 Million/uL   Hemoglobin 11.5 (L) 11.7 - 15.5 g/dL   HCT 64.2 64.9 - 54.9 %   MCV 93.5 80.0 - 100.0 fL   MCH 30.1 27.0 - 33.0 pg   MCHC 32.2 32.0 - 36.0 g/dL   RDW 87.1 88.9 - 84.9 %   Platelets 182 140 - 400 Thousand/uL  MPV 10.9 7.5 - 12.5 fL   Neutro Abs 3,356 1,500 - 7,800 cells/uL   Absolute Lymphocytes 940 850 - 3,900 cells/uL   Absolute Monocytes 338 200 - 950 cells/uL   Eosinophils Absolute 38 15 - 500 cells/uL   Basophils Absolute 28 0 - 200 cells/uL   Neutrophils Relative % 71.4 %   Total Lymphocyte 20.0 %   Monocytes Relative 7.2 %   Eosinophils Relative 0.8 %   Basophils Relative 0.6 %  Ferritin   Collection Time: 12/14/23  9:36 AM  Result Value Ref Range   Ferritin 31.9 10.0 - 291.0 ng/mL  TSH   Collection Time: 12/14/23  9:36 AM  Result Value Ref Range   TSH 0.84 0.35 - 5.50 uIU/mL  Basic metabolic panel with GFR   Collection Time: 12/14/23  9:36 AM  Result Value Ref Range   Sodium 137 135 - 145 mEq/L   Potassium 4.6 3.5 - 5.1 mEq/L   Chloride 102 96 - 112 mEq/L   CO2 28 19 - 32 mEq/L   Glucose, Bld 90 70 - 99 mg/dL   BUN 16 6 - 23 mg/dL   Creatinine, Ser 9.39 0.40 - 1.20 mg/dL   GFR 08.88 >39.99 mL/min   Calcium 9.4 8.4 - 10.5 mg/dL

## 2023-12-14 ENCOUNTER — Encounter: Payer: Self-pay | Admitting: Family Medicine

## 2023-12-14 ENCOUNTER — Ambulatory Visit (INDEPENDENT_AMBULATORY_CARE_PROVIDER_SITE_OTHER): Admitting: Family Medicine

## 2023-12-14 VITALS — BP 112/70 | HR 73 | Ht 66.0 in | Wt 125.6 lb

## 2023-12-14 DIAGNOSIS — D649 Anemia, unspecified: Secondary | ICD-10-CM | POA: Diagnosis not present

## 2023-12-14 DIAGNOSIS — Z808 Family history of malignant neoplasm of other organs or systems: Secondary | ICD-10-CM

## 2023-12-14 DIAGNOSIS — H5711 Ocular pain, right eye: Secondary | ICD-10-CM | POA: Diagnosis not present

## 2023-12-14 DIAGNOSIS — D72819 Decreased white blood cell count, unspecified: Secondary | ICD-10-CM | POA: Diagnosis not present

## 2023-12-14 DIAGNOSIS — R634 Abnormal weight loss: Secondary | ICD-10-CM

## 2023-12-14 LAB — CBC WITH DIFFERENTIAL/PLATELET
Absolute Lymphocytes: 940 {cells}/uL (ref 850–3900)
Absolute Monocytes: 338 {cells}/uL (ref 200–950)
Basophils Absolute: 28 {cells}/uL (ref 0–200)
Basophils Relative: 0.6 %
Eosinophils Absolute: 38 {cells}/uL (ref 15–500)
Eosinophils Relative: 0.8 %
HCT: 35.7 % (ref 35.0–45.0)
Hemoglobin: 11.5 g/dL — ABNORMAL LOW (ref 11.7–15.5)
MCH: 30.1 pg (ref 27.0–33.0)
MCHC: 32.2 g/dL (ref 32.0–36.0)
MCV: 93.5 fL (ref 80.0–100.0)
MPV: 10.9 fL (ref 7.5–12.5)
Monocytes Relative: 7.2 %
Neutro Abs: 3356 {cells}/uL (ref 1500–7800)
Neutrophils Relative %: 71.4 %
Platelets: 182 Thousand/uL (ref 140–400)
RBC: 3.82 Million/uL (ref 3.80–5.10)
RDW: 12.8 % (ref 11.0–15.0)
Total Lymphocyte: 20 %
WBC: 4.7 Thousand/uL (ref 3.8–10.8)

## 2023-12-14 LAB — BASIC METABOLIC PANEL WITH GFR
BUN: 16 mg/dL (ref 6–23)
CO2: 28 meq/L (ref 19–32)
Calcium: 9.4 mg/dL (ref 8.4–10.5)
Chloride: 102 meq/L (ref 96–112)
Creatinine, Ser: 0.6 mg/dL (ref 0.40–1.20)
GFR: 91.11 mL/min (ref >60.00)
Glucose, Bld: 90 mg/dL (ref 70–99)
Potassium: 4.6 meq/L (ref 3.5–5.1)
Sodium: 137 meq/L (ref 135–145)

## 2023-12-14 LAB — FERRITIN: Ferritin: 31.9 ng/mL (ref 10.0–291.0)

## 2023-12-14 LAB — TSH: TSH: 0.84 u[IU]/mL (ref 0.35–5.50)

## 2023-12-14 NOTE — Patient Instructions (Addendum)
 Good to see you today- I will be in touch with your labs asap Please do try and gain some weight intentionally- we will also check your thyroid  I have ordered a CT of your head to rule out brain tumor.  We can also consider a CT of your chest/ abdomen and pelvis for weight lsos

## 2023-12-29 ENCOUNTER — Telehealth: Payer: Self-pay

## 2023-12-29 NOTE — Telephone Encounter (Signed)
 Noted

## 2023-12-29 NOTE — Telephone Encounter (Signed)
 Copied from CRM [id number]. Topic: Clinical - Medication Prior Auth >> Dec 29, 2023  9:35 AM Terri MATSU wrote: Reason for CRM: Sueanne from [place] prior [facility] department called for patient stating patient has an appointment tomorrow and needs that prior auth corrected ASAP before patient's appointment tomorrow. She also stated she email a couple times. She needs someone to contact her ASAP about this. Callback number is [phone] extension [phone]

## 2023-12-30 ENCOUNTER — Ambulatory Visit (HOSPITAL_BASED_OUTPATIENT_CLINIC_OR_DEPARTMENT_OTHER): Admission: RE | Admit: 2023-12-30 | Source: Ambulatory Visit

## 2023-12-30 ENCOUNTER — Other Ambulatory Visit: Payer: Self-pay | Admitting: Family Medicine

## 2023-12-30 DIAGNOSIS — Z808 Family history of malignant neoplasm of other organs or systems: Secondary | ICD-10-CM

## 2023-12-30 DIAGNOSIS — R634 Abnormal weight loss: Secondary | ICD-10-CM

## 2023-12-30 DIAGNOSIS — H5711 Ocular pain, right eye: Secondary | ICD-10-CM

## 2023-12-30 NOTE — Telephone Encounter (Signed)
 See below

## 2023-12-30 NOTE — Telephone Encounter (Unsigned)
 Copied from CRM [id number]. Topic: Clinical - Medication Prior Auth >> Dec 29, 2023  9:35 AM Terri MATSU wrote: Reason for CRM: Sueanne from [place] prior [facility] department called for patient stating patient has an appointment tomorrow and needs that prior auth corrected ASAP before patient's appointment tomorrow. She also stated she email a couple times. She needs someone to contact her ASAP about this. Callback number is [phone] extension [phone]

## 2023-12-31 ENCOUNTER — Other Ambulatory Visit: Payer: Self-pay | Admitting: Family Medicine

## 2023-12-31 DIAGNOSIS — H5711 Ocular pain, right eye: Secondary | ICD-10-CM

## 2023-12-31 DIAGNOSIS — Z808 Family history of malignant neoplasm of other organs or systems: Secondary | ICD-10-CM

## 2023-12-31 DIAGNOSIS — R634 Abnormal weight loss: Secondary | ICD-10-CM

## 2024-01-01 ENCOUNTER — Ambulatory Visit (HOSPITAL_BASED_OUTPATIENT_CLINIC_OR_DEPARTMENT_OTHER)
Admission: RE | Admit: 2024-01-01 | Discharge: 2024-01-01 | Disposition: A | Discharge status: Home / Self Care | Source: Ambulatory Visit | Attending: Family Medicine | Admitting: Family Medicine

## 2024-01-01 ENCOUNTER — Encounter (HOSPITAL_BASED_OUTPATIENT_CLINIC_OR_DEPARTMENT_OTHER): Payer: Self-pay

## 2024-01-01 ENCOUNTER — Ambulatory Visit (HOSPITAL_BASED_OUTPATIENT_CLINIC_OR_DEPARTMENT_OTHER)

## 2024-01-01 DIAGNOSIS — H5711 Ocular pain, right eye: Secondary | ICD-10-CM | POA: Diagnosis not present

## 2024-01-01 DIAGNOSIS — C719 Malignant neoplasm of brain, unspecified: Secondary | ICD-10-CM | POA: Diagnosis not present

## 2024-01-01 DIAGNOSIS — Z808 Family history of malignant neoplasm of other organs or systems: Secondary | ICD-10-CM | POA: Diagnosis not present

## 2024-01-01 DIAGNOSIS — R634 Abnormal weight loss: Secondary | ICD-10-CM | POA: Insufficient documentation

## 2024-01-06 ENCOUNTER — Other Ambulatory Visit: Payer: Self-pay | Admitting: Family Medicine

## 2024-01-12 ENCOUNTER — Encounter: Payer: Self-pay | Admitting: Family Medicine

## 2024-02-04 ENCOUNTER — Other Ambulatory Visit (HOSPITAL_BASED_OUTPATIENT_CLINIC_OR_DEPARTMENT_OTHER): Payer: Self-pay

## 2024-02-04 MED ORDER — COMIRNATY 30 MCG/0.3ML IM SUSY
0.3000 mL | PREFILLED_SYRINGE | Freq: Once | INTRAMUSCULAR | 0 refills | Status: AC
  Filled 2024-02-04: qty 0.3, 1d supply, fill #0

## 2024-02-04 MED ORDER — FLUZONE HIGH-DOSE 0.5 ML IM SUSY
0.5000 mL | PREFILLED_SYRINGE | Freq: Once | INTRAMUSCULAR | 0 refills | Status: AC
  Filled 2024-02-04: qty 0.5, 1d supply, fill #0

## 2024-04-18 ENCOUNTER — Other Ambulatory Visit: Payer: Self-pay | Admitting: Family Medicine

## 2024-06-05 ENCOUNTER — Encounter (HOSPITAL_BASED_OUTPATIENT_CLINIC_OR_DEPARTMENT_OTHER): Payer: Self-pay

## 2024-06-05 ENCOUNTER — Emergency Department (HOSPITAL_BASED_OUTPATIENT_CLINIC_OR_DEPARTMENT_OTHER)

## 2024-06-05 ENCOUNTER — Emergency Department (HOSPITAL_BASED_OUTPATIENT_CLINIC_OR_DEPARTMENT_OTHER)
Admission: EM | Admit: 2024-06-05 | Discharge: 2024-06-05 | Disposition: A | Discharge status: Home / Self Care | Attending: Emergency Medicine | Admitting: Emergency Medicine

## 2024-06-05 DIAGNOSIS — S52502A Unspecified fracture of the lower end of left radius, initial encounter for closed fracture: Secondary | ICD-10-CM | POA: Insufficient documentation

## 2024-06-05 DIAGNOSIS — S52591A Other fractures of lower end of right radius, initial encounter for closed fracture: Secondary | ICD-10-CM | POA: Insufficient documentation

## 2024-06-05 DIAGNOSIS — S52501A Unspecified fracture of the lower end of right radius, initial encounter for closed fracture: Secondary | ICD-10-CM

## 2024-06-05 DIAGNOSIS — W1839XA Other fall on same level, initial encounter: Secondary | ICD-10-CM | POA: Insufficient documentation

## 2024-06-05 DIAGNOSIS — W19XXXA Unspecified fall, initial encounter: Secondary | ICD-10-CM

## 2024-06-05 DIAGNOSIS — W000XXA Fall on same level due to ice and snow, initial encounter: Secondary | ICD-10-CM | POA: Insufficient documentation

## 2024-06-05 MED ORDER — LIDOCAINE HCL 2 % IJ SOLN: 10.0000 mL | Freq: Once | INTRAMUSCULAR | Status: DC

## 2024-06-05 MED ORDER — ONDANSETRON 4 MG PO TBDP
4.0000 mg | ORAL_TABLET | Freq: Once | ORAL | Status: AC
  Administered 2024-06-05: 4 mg via ORAL
  Filled 2024-06-05: qty 1

## 2024-06-05 MED ORDER — FENTANYL CITRATE (PF) 50 MCG/ML IJ SOSY
50.0000 ug | PREFILLED_SYRINGE | Freq: Once | INTRAMUSCULAR | Status: AC
  Administered 2024-06-05: 50 ug via INTRAVENOUS
  Filled 2024-06-05: qty 1

## 2024-06-05 MED ORDER — TRAMADOL HCL 50 MG PO TABS: 50.0000 mg | ORAL_TABLET | Freq: Four times a day (QID) | ORAL | 0 refills | Status: AC | PRN

## 2024-06-05 MED ORDER — LIDOCAINE HCL (PF) 1 % IJ SOLN
15.0000 mL | Freq: Once | INTRAMUSCULAR | Status: AC
  Administered 2024-06-05: 15 mL
  Filled 2024-06-05: qty 15

## 2024-06-05 NOTE — ED Provider Notes (Signed)
 " [place] [facility] Provider Note   CSN: [id number] Arrival date & time: 06/05/24  9180     Patient presents with: Michelle Rosales is a 69 y.o. female.   HPI     Slipped on ice, 630 AM and hurt left wrist. Initially hip sore but now feeling better, able to ambulate No head trauma, no blood thinners, no headache. No vomiting. Initially right when fell had nausea, lightheadedness with fall, husband helped get her in the garage and she sat there for about 20 minutes not feeling well.  Now not feeling lightheaded or nausea.  No symptoms prior to the fall. No neck pain, back pain, chest pain, abdominal pain  No numbness or weakness No change in vision  Past Medical History:  Diagnosis Date   GERD (gastroesophageal reflux disease)      Prior to Admission medications  Medication Sig Start Date End Date Taking? Authorizing Provider  traMADol  (ULTRAM ) 50 MG tablet Take 1 tablet (50 mg total) by mouth every 6 (six) hours as needed. 06/05/24  Yes Dreama Longs, MD  acetaminophen (TYLENOL) 325 MG tablet Take by mouth. 05/04/15   [provider]  cetirizine (ZYRTEC) 10 MG tablet Take 10 mg by mouth daily.    [provider]  diclofenac  (VOLTAREN ) 75 MG EC tablet Take 1 tablet by mouth twice daily as needed 08/11/23   Copland, Harlene JAYSON, MD  fluconazole  (DIFLUCAN ) 150 MG tablet Take 1 tablet by mouth once a week 04/18/24   Copland, Jessica C, MD  fluticasone  (FLONASE ) 50 MCG/ACT nasal spray Place 2 sprays into both nostrils daily. 06/10/22   Jason Leita Repine, FNP  pravastatin  (PRAVACHOL ) 20 MG tablet Take 1 tablet (20 mg total) by mouth daily. 11/09/23   Copland, Jessica C, MD    Allergies: Venlafaxine    Review of Systems  Updated Vital Signs BP 116/76 (BP Location: Right Arm)   Pulse (!) 56   Temp (!) 97.5 F (36.4 C) (Oral)   Resp 16   Ht 5' 6 (1.676 m)   Wt 58.1 kg   SpO2 100%   BMI 20.66 kg/m   Physical  Exam Vitals and nursing note reviewed.  Constitutional:      General: She is not in acute distress.    Appearance: She is well-developed. She is not diaphoretic.  HENT:     Head: Normocephalic and atraumatic.  Eyes:     Conjunctiva/sclera: Conjunctivae normal.  Cardiovascular:     Rate and Rhythm: Normal rate and regular rhythm.     Pulses: Normal pulses.     Heart sounds: Normal heart sounds. No murmur heard.    No friction rub. No gallop.  Pulmonary:     Effort: Pulmonary effort is normal. No respiratory distress.     Breath sounds: Normal breath sounds. No wheezing or rales.  Abdominal:     General: There is no distension.     Palpations: Abdomen is soft.     Tenderness: There is no abdominal tenderness. There is no guarding.  Musculoskeletal:        General: Swelling, tenderness and deformity (left wrist) present.     Cervical back: Normal range of motion.     Comments: Pain with movement but able to perform opponens, normal sensation, normal cap refill and radial pulse No tenderness to c/t/l spine nor other extremity tenderness, no elbow tenderness  Skin:    General: Skin is warm and dry.  Findings: No erythema or rash.  Neurological:     Mental Status: She is alert and oriented to person, place, and time.     (all labs ordered are listed, but only abnormal results are displayed) Labs Reviewed - No data to display  EKG: None  Radiology: DG Wrist Complete Left Result Date: 06/05/2024 EXAM: 3 VIEW(S) XRAY OF THE LEFT WRIST 06/05/2024 10:33:32 AM COMPARISON: 06/05/2024 CLINICAL HISTORY: Post reduction. FINDINGS: BONES AND JOINTS: Status post casting. Improved alignment of distal radial fracture with persistent dorsal displacement. SOFT TISSUES: Unremarkable. IMPRESSION: 1. Improved alignment of distal radial fracture with persistent dorsal displacement, status post casting. Electronically signed by: Waddell Calk MD 06/05/2024 10:38 AM EST RP Workstation: [id number]    DG Wrist Complete Left Result Date: 06/05/2024 CLINICAL DATA:  injury EXAM: LEFT WRIST - COMPLETE 3+ VIEW COMPARISON:  None Available. FINDINGS: There is an impaction fracture of the distal radius with moderate comminution. There is dorsal angulation of the distal fragment of the radius. There is favored intra-articular extension into the radiocarpal joint and possible extension into the distal radioulnar joint. Associated soft tissue edema. Moderate degenerative changes of the first CMC. IMPRESSION: Comminuted impaction fracture of the distal radius with favored intra-articular extension. Electronically Signed   By: Corean Salter M.D.   On: 06/05/2024 09:14     .Ortho Injury Treatment  Date/Time: 06/05/2024 1:14 PM  Performed by: Dreama Longs, MD Authorized by: Dreama Longs, MD   Consent:    Consent obtained:  Verbal   Consent given by:  Patient   Risks discussed:  Irreducible dislocation, restricted joint movement, vascular damage, nerve damage, recurrent dislocation and stiffness   Alternatives discussed:  No treatmentInjury location: wrist Location details: left wrist Injury type: fracture-dislocation Pre-procedure neurovascular assessment: neurovascularly intact Pre-procedure distal perfusion: normal Pre-procedure neurological function: normal Pre-procedure range of motion: reduced Pre-procedure range of motion comment: pain limited Anesthesia: hematoma block  Anesthesia: Local anesthesia used: yes Local Anesthetic: lidocaine  1% without epinephrine Anesthetic total: 10 mL  Patient sedated: NoManipulation performed: yes Skin traction used: yes Reduction successful: improved but persistent displacement. X-ray confirmed reduction: yes Immobilization: splint Splint type: sugar tong Splint Applied by: ED Tech Post-procedure neurovascular assessment: post-procedure neurovascularly intact Post-procedure distal perfusion: normal Post-procedure neurological function:  normal Post-procedure range of motion comment: limited by splint, normal finger movements      Medications Ordered in the ED  ondansetron  (ZOFRAN -ODT) disintegrating tablet 4 mg (4 mg Oral Given 06/05/24 0855)  fentaNYL  (SUBLIMAZE ) injection 50 mcg (50 mcg Intravenous Given 06/05/24 0855)  lidocaine  (PF) (XYLOCAINE ) 1 % injection 15 mL (15 mLs Other Given by Other 06/05/24 0925)  fentaNYL  (SUBLIMAZE ) injection 50 mcg (50 mcg Intravenous Given 06/05/24 1002)                                    Medical Decision Making Amount and/or Complexity of Data Reviewed Radiology: ordered.  Risk Prescription drug management.   69 year old female with history of hyperlipidemia and reflux presents with concern for slip on fall on the ice with left wrist pain.  Denies any other injuries from the fall, briefly had hip pain but has been able to ambulate with pain at this time and have low suspicion for fracture.  Did not hit her head, no headache, no anticoagulation, no neck pain or back pain and have low suspicion for intracranial injuries, cervical thoracic or lumbar injuries.  No sign of  other injuries by history or exam.  No other recent illness.  She had nausea and lightheadedness immediately after the fall that I suspect was vasovagal in the setting of trauma and pain.  No other neurologic symptoms.  X-ray of the left wrist evaluated by me shows a distal radius fracture with dorsal anbulation.  It is a closed fracture and she is neurovascularly intact.  Hematoma block given, placed in traction.  Reduction performed and sugar tong placed.  Improved alignment of distal radial fracture with persistent dorsal displacement. Continues to be NV intact. To follow up with Dr. Omie phone number, hopeful for Wednesday follow up. Given rx for tramadol  for breakthrough pain after discussion of risks.  Patient discharged in stable condition with understanding of reasons to return.      Final diagnoses:   Fall, initial encounter  Closed fracture of distal end of right radius, unspecified fracture morphology, initial encounter    ED Discharge Orders          Ordered    traMADol  (ULTRAM ) 50 MG tablet  Every 6 hours PRN        06/05/24 1049               Dreama Longs, MD 06/05/24 1317  "

## 2024-06-05 NOTE — ED Triage Notes (Signed)
 Pt states that she slipped on the ice and fell this morning, feels that she broke her left wrist.

## 2024-06-09 ENCOUNTER — Ambulatory Visit: Payer: Medicare HMO

## 2024-07-06 ENCOUNTER — Ambulatory Visit

## 2024-09-21 ENCOUNTER — Encounter: Admitting: Family Medicine
